# Patient Record
Sex: Female | Born: 1935 | Race: White | Hispanic: No | Smoking: Former smoker
Health system: Southern US, Community
[De-identification: ages and names within clinical notes are randomized; demographics above are authoritative.]

## PROBLEM LIST (undated history)

## (undated) DIAGNOSIS — I10 Essential (primary) hypertension: Secondary | ICD-10-CM

## (undated) DIAGNOSIS — C3490 Malignant neoplasm of unspecified part of unspecified bronchus or lung: Secondary | ICD-10-CM

## (undated) DIAGNOSIS — C50919 Malignant neoplasm of unspecified site of unspecified female breast: Secondary | ICD-10-CM

## (undated) DIAGNOSIS — J449 Chronic obstructive pulmonary disease, unspecified: Secondary | ICD-10-CM

## (undated) DIAGNOSIS — E119 Type 2 diabetes mellitus without complications: Secondary | ICD-10-CM

## (undated) DIAGNOSIS — IMO0002 Reserved for concepts with insufficient information to code with codable children: Secondary | ICD-10-CM

## (undated) HISTORY — PX: BREAST LUMPECTOMY: SHX2

## (undated) HISTORY — DX: Type 2 diabetes mellitus without complications: E11.9

## (undated) HISTORY — DX: Malignant neoplasm of unspecified part of unspecified bronchus or lung: C34.90

## (undated) HISTORY — DX: Malignant neoplasm of unspecified site of unspecified female breast: C50.919

## (undated) HISTORY — PX: LUNG REMOVAL, PARTIAL: SHX233

## (undated) HISTORY — DX: Chronic obstructive pulmonary disease, unspecified: J44.9

## (undated) HISTORY — PX: ABDOMINAL HYSTERECTOMY: SHX81

## (undated) HISTORY — DX: Essential (primary) hypertension: I10

## (undated) HISTORY — DX: Reserved for concepts with insufficient information to code with codable children: IMO0002

## (undated) HISTORY — PX: BACK SURGERY: SHX140

---

## 2000-06-29 ENCOUNTER — Inpatient Hospital Stay (HOSPITAL_COMMUNITY): Admission: RE | Admit: 2000-06-29 | Discharge: 2000-06-29 | Payer: Self-pay | Admitting: Neurosurgery

## 2000-06-29 ENCOUNTER — Encounter: Payer: Self-pay | Admitting: Neurosurgery

## 2000-09-11 ENCOUNTER — Encounter: Payer: Self-pay | Admitting: Neurosurgery

## 2000-09-11 ENCOUNTER — Encounter: Admission: RE | Admit: 2000-09-11 | Discharge: 2000-09-11 | Payer: Self-pay | Admitting: Neurosurgery

## 2005-12-19 ENCOUNTER — Ambulatory Visit: Payer: Self-pay | Admitting: Internal Medicine

## 2006-11-20 ENCOUNTER — Ambulatory Visit: Payer: Self-pay | Admitting: Internal Medicine

## 2006-12-06 ENCOUNTER — Ambulatory Visit: Payer: Self-pay | Admitting: Internal Medicine

## 2010-01-26 ENCOUNTER — Ambulatory Visit: Payer: Self-pay | Admitting: Internal Medicine

## 2010-02-05 ENCOUNTER — Ambulatory Visit: Payer: Self-pay | Admitting: Internal Medicine

## 2010-02-16 ENCOUNTER — Ambulatory Visit: Payer: Self-pay | Admitting: Internal Medicine

## 2010-02-23 ENCOUNTER — Ambulatory Visit: Payer: Self-pay | Admitting: Internal Medicine

## 2010-02-24 LAB — PROT IMMUNOELECTROPHORES(ARMC)

## 2010-02-24 LAB — CANCER ANTIGEN 27.29: CA 27.29: 29.7 U/mL (ref 0.0–38.6)

## 2010-02-25 ENCOUNTER — Ambulatory Visit: Payer: Self-pay | Admitting: Internal Medicine

## 2010-03-28 ENCOUNTER — Ambulatory Visit: Payer: Self-pay | Admitting: Internal Medicine

## 2010-04-06 ENCOUNTER — Ambulatory Visit: Payer: Self-pay | Admitting: Surgery

## 2011-03-07 ENCOUNTER — Ambulatory Visit: Payer: Self-pay | Admitting: Internal Medicine

## 2012-03-29 ENCOUNTER — Ambulatory Visit: Payer: Self-pay | Admitting: Internal Medicine

## 2013-06-11 ENCOUNTER — Inpatient Hospital Stay: Payer: Self-pay | Admitting: Internal Medicine

## 2013-06-11 LAB — URINALYSIS, COMPLETE
BLOOD: NEGATIVE
Bilirubin,UR: NEGATIVE
GLUCOSE, UR: NEGATIVE mg/dL (ref 0–75)
Ketone: NEGATIVE
Nitrite: NEGATIVE
PH: 5 (ref 4.5–8.0)
Protein: NEGATIVE
RBC,UR: 1 /HPF (ref 0–5)
SPECIFIC GRAVITY: 1.012 (ref 1.003–1.030)
Squamous Epithelial: <1
WBC UR: 7 /HPF (ref 0–5)

## 2013-06-11 LAB — CBC
HCT: 35.3 % (ref 35.0–47.0)
HGB: 10.8 g/dL — ABNORMAL LOW (ref 12.0–16.0)
MCH: 27.4 pg (ref 26.0–34.0)
MCHC: 30.7 g/dL — ABNORMAL LOW (ref 32.0–36.0)
MCV: 89 fL (ref 80–100)
Platelet: 278 10*3/uL (ref 150–440)
RBC: 3.95 10*6/uL (ref 3.80–5.20)
RDW: 15.1 % — ABNORMAL HIGH (ref 11.5–14.5)
WBC: 11.3 10*3/uL — ABNORMAL HIGH (ref 3.6–11.0)

## 2013-06-11 LAB — COMPREHENSIVE METABOLIC PANEL
ALBUMIN: 3 g/dL — AB (ref 3.4–5.0)
Alkaline Phosphatase: 68 U/L
Anion Gap: 2 — ABNORMAL LOW (ref 7–16)
BUN: 26 mg/dL — AB (ref 7–18)
Bilirubin,Total: 0.2 mg/dL (ref 0.2–1.0)
CALCIUM: 8.7 mg/dL (ref 8.5–10.1)
CHLORIDE: 110 mmol/L — AB (ref 98–107)
CO2: 27 mmol/L (ref 21–32)
CREATININE: 1.08 mg/dL (ref 0.60–1.30)
EGFR (African American): 53 — ABNORMAL LOW
GFR CALC NON AF AMER: 46 — AB
Glucose: 133 mg/dL — ABNORMAL HIGH (ref 65–99)
OSMOLALITY: 284 (ref 275–301)
Potassium: 4.3 mmol/L (ref 3.5–5.1)
SGOT(AST): 18 U/L (ref 15–37)
SGPT (ALT): 15 U/L (ref 12–78)
SODIUM: 139 mmol/L (ref 136–145)
Total Protein: 6.3 g/dL — ABNORMAL LOW (ref 6.4–8.2)

## 2013-06-11 LAB — TROPONIN I: Troponin-I: <0.02 ng/mL

## 2013-06-13 LAB — BASIC METABOLIC PANEL
Anion Gap: 5 — ABNORMAL LOW (ref 7–16)
BUN: 19 mg/dL — AB (ref 7–18)
CHLORIDE: 105 mmol/L (ref 98–107)
CO2: 27 mmol/L (ref 21–32)
Calcium, Total: 8.9 mg/dL (ref 8.5–10.1)
Creatinine: 1.1 mg/dL (ref 0.60–1.30)
EGFR (Non-African Amer.): 45 — ABNORMAL LOW
GFR CALC AF AMER: 52 — AB
Glucose: 114 mg/dL — ABNORMAL HIGH (ref 65–99)
OSMOLALITY: 277 (ref 275–301)
Potassium: 4.5 mmol/L (ref 3.5–5.1)
SODIUM: 137 mmol/L (ref 136–145)

## 2013-06-13 LAB — CBC WITH DIFFERENTIAL/PLATELET
BASOS ABS: 0 10*3/uL (ref 0.0–0.1)
BASOS PCT: 0.5 %
Eosinophil #: 0.2 10*3/uL (ref 0.0–0.7)
Eosinophil %: 2.5 %
HCT: 33.6 % — AB (ref 35.0–47.0)
HGB: 11 g/dL — AB (ref 12.0–16.0)
Lymphocyte #: 1.1 10*3/uL (ref 1.0–3.6)
Lymphocyte %: 14.1 %
MCH: 28.8 pg (ref 26.0–34.0)
MCHC: 32.6 g/dL (ref 32.0–36.0)
MCV: 88 fL (ref 80–100)
Monocyte #: 1 x10 3/mm — ABNORMAL HIGH (ref 0.2–0.9)
Monocyte %: 12.3 %
Neutrophil #: 5.5 10*3/uL (ref 1.4–6.5)
Neutrophil %: 70.6 %
Platelet: 219 10*3/uL (ref 150–440)
RBC: 3.81 10*6/uL (ref 3.80–5.20)
RDW: 15.1 % — AB (ref 11.5–14.5)
WBC: 7.7 10*3/uL (ref 3.6–11.0)

## 2013-06-14 LAB — HEMATOCRIT: HCT: 34.5 % — AB (ref 35.0–47.0)

## 2013-06-15 LAB — BASIC METABOLIC PANEL
ANION GAP: 3 — AB (ref 7–16)
BUN: 34 mg/dL — ABNORMAL HIGH (ref 7–18)
CO2: 28 mmol/L (ref 21–32)
Calcium, Total: 8.3 mg/dL — ABNORMAL LOW (ref 8.5–10.1)
Chloride: 106 mmol/L (ref 98–107)
Creatinine: 1.13 mg/dL (ref 0.60–1.30)
EGFR (African American): 51 — ABNORMAL LOW
GFR CALC NON AF AMER: 44 — AB
Glucose: 117 mg/dL — ABNORMAL HIGH (ref 65–99)
Osmolality: 282 (ref 275–301)
POTASSIUM: 4 mmol/L (ref 3.5–5.1)
SODIUM: 137 mmol/L (ref 136–145)

## 2013-06-15 LAB — CBC WITH DIFFERENTIAL/PLATELET
BASOS ABS: 0.1 10*3/uL (ref 0.0–0.1)
BASOS PCT: 0.6 %
EOS ABS: 0.4 10*3/uL (ref 0.0–0.7)
EOS PCT: 4.2 %
HCT: 32.8 % — AB (ref 35.0–47.0)
HGB: 10.7 g/dL — AB (ref 12.0–16.0)
LYMPHS PCT: 19.8 %
Lymphocyte #: 1.7 10*3/uL (ref 1.0–3.6)
MCH: 28.8 pg (ref 26.0–34.0)
MCHC: 32.5 g/dL (ref 32.0–36.0)
MCV: 89 fL (ref 80–100)
MONO ABS: 1.3 x10 3/mm — AB (ref 0.2–0.9)
MONOS PCT: 14.9 %
NEUTROS ABS: 5.1 10*3/uL (ref 1.4–6.5)
NEUTROS PCT: 60.5 %
Platelet: 254 10*3/uL (ref 150–440)
RBC: 3.7 10*6/uL — AB (ref 3.80–5.20)
RDW: 15 % — ABNORMAL HIGH (ref 11.5–14.5)
WBC: 8.5 10*3/uL (ref 3.6–11.0)

## 2013-06-16 LAB — CBC WITH DIFFERENTIAL/PLATELET
BASOS ABS: 0.1 10*3/uL (ref 0.0–0.1)
Basophil %: 0.7 %
EOS PCT: 6.9 %
Eosinophil #: 0.5 10*3/uL (ref 0.0–0.7)
HCT: 31.3 % — ABNORMAL LOW (ref 35.0–47.0)
HGB: 10.6 g/dL — ABNORMAL LOW (ref 12.0–16.0)
LYMPHS PCT: 23.1 %
Lymphocyte #: 1.8 10*3/uL (ref 1.0–3.6)
MCH: 30.2 pg (ref 26.0–34.0)
MCHC: 33.9 g/dL (ref 32.0–36.0)
MCV: 89 fL (ref 80–100)
MONO ABS: 1 x10 3/mm — AB (ref 0.2–0.9)
MONOS PCT: 12.8 %
NEUTROS PCT: 56.5 %
Neutrophil #: 4.4 10*3/uL (ref 1.4–6.5)
PLATELETS: 264 10*3/uL (ref 150–440)
RBC: 3.51 10*6/uL — ABNORMAL LOW (ref 3.80–5.20)
RDW: 14.9 % — ABNORMAL HIGH (ref 11.5–14.5)
WBC: 7.8 10*3/uL (ref 3.6–11.0)

## 2013-06-16 LAB — BASIC METABOLIC PANEL
ANION GAP: 5 — AB (ref 7–16)
BUN: 37 mg/dL — ABNORMAL HIGH (ref 7–18)
CALCIUM: 8.4 mg/dL — AB (ref 8.5–10.1)
Chloride: 106 mmol/L (ref 98–107)
Co2: 28 mmol/L (ref 21–32)
Creatinine: 1.14 mg/dL (ref 0.60–1.30)
EGFR (African American): 50 — ABNORMAL LOW
EGFR (Non-African Amer.): 43 — ABNORMAL LOW
GLUCOSE: 125 mg/dL — AB (ref 65–99)
Osmolality: 288 (ref 275–301)
Potassium: 4.6 mmol/L (ref 3.5–5.1)
Sodium: 139 mmol/L (ref 136–145)

## 2013-06-17 ENCOUNTER — Encounter: Payer: Self-pay | Admitting: Internal Medicine

## 2013-06-17 LAB — BASIC METABOLIC PANEL
ANION GAP: 3 — AB (ref 7–16)
BUN: 35 mg/dL — ABNORMAL HIGH (ref 7–18)
CALCIUM: 8.7 mg/dL (ref 8.5–10.1)
Chloride: 108 mmol/L — ABNORMAL HIGH (ref 98–107)
Co2: 27 mmol/L (ref 21–32)
Creatinine: 1.02 mg/dL (ref 0.60–1.30)
EGFR (Non-African Amer.): 49 — ABNORMAL LOW
GFR CALC AF AMER: 57 — AB
Glucose: 110 mg/dL — ABNORMAL HIGH (ref 65–99)
Osmolality: 284 (ref 275–301)
POTASSIUM: 4.7 mmol/L (ref 3.5–5.1)
Sodium: 138 mmol/L (ref 136–145)

## 2013-06-17 LAB — CBC WITH DIFFERENTIAL/PLATELET
Basophil #: 0 10*3/uL (ref 0.0–0.1)
Basophil %: 0.5 %
Eosinophil #: 0.5 10*3/uL (ref 0.0–0.7)
Eosinophil %: 6.5 %
HCT: 32.5 % — AB (ref 35.0–47.0)
HGB: 10.4 g/dL — ABNORMAL LOW (ref 12.0–16.0)
LYMPHS PCT: 18.1 %
Lymphocyte #: 1.4 10*3/uL (ref 1.0–3.6)
MCH: 28.6 pg (ref 26.0–34.0)
MCHC: 32.1 g/dL (ref 32.0–36.0)
MCV: 89 fL (ref 80–100)
MONOS PCT: 12.3 %
Monocyte #: 0.9 x10 3/mm (ref 0.2–0.9)
NEUTROS ABS: 4.8 10*3/uL (ref 1.4–6.5)
Neutrophil %: 62.6 %
PLATELETS: 309 10*3/uL (ref 150–440)
RBC: 3.64 10*6/uL — ABNORMAL LOW (ref 3.80–5.20)
RDW: 15.2 % — ABNORMAL HIGH (ref 11.5–14.5)
WBC: 7.7 10*3/uL (ref 3.6–11.0)

## 2013-06-26 ENCOUNTER — Encounter: Payer: Self-pay | Admitting: Internal Medicine

## 2013-11-06 ENCOUNTER — Other Ambulatory Visit: Payer: Self-pay | Admitting: Internal Medicine

## 2013-11-06 DIAGNOSIS — R05 Cough: Secondary | ICD-10-CM | POA: Insufficient documentation

## 2013-11-06 DIAGNOSIS — R059 Cough, unspecified: Secondary | ICD-10-CM | POA: Insufficient documentation

## 2013-11-06 DIAGNOSIS — R600 Localized edema: Secondary | ICD-10-CM | POA: Insufficient documentation

## 2013-11-06 LAB — D-DIMER(ARMC): D-Dimer: 1170 ng/ml

## 2013-11-08 ENCOUNTER — Ambulatory Visit: Payer: Self-pay | Admitting: Internal Medicine

## 2013-11-11 ENCOUNTER — Ambulatory Visit: Payer: Self-pay | Admitting: Internal Medicine

## 2013-11-21 ENCOUNTER — Ambulatory Visit: Payer: Self-pay | Admitting: Internal Medicine

## 2013-11-28 ENCOUNTER — Ambulatory Visit: Payer: Self-pay | Admitting: Internal Medicine

## 2013-12-04 ENCOUNTER — Ambulatory Visit: Payer: Self-pay | Admitting: Internal Medicine

## 2013-12-04 LAB — CBC CANCER CENTER
Basophil #: 0.1 x10 3/mm (ref 0.0–0.1)
Basophil %: 1.1 %
Eosinophil #: 0.3 x10 3/mm (ref 0.0–0.7)
Eosinophil %: 3.6 %
HCT: 34.6 % — ABNORMAL LOW (ref 35.0–47.0)
HGB: 10.8 g/dL — ABNORMAL LOW (ref 12.0–16.0)
Lymphocyte #: 1.5 x10 3/mm (ref 1.0–3.6)
Lymphocyte %: 18.7 %
MCH: 26.1 pg (ref 26.0–34.0)
MCHC: 31.3 g/dL — ABNORMAL LOW (ref 32.0–36.0)
MCV: 84 fL (ref 80–100)
Monocyte #: 0.6 x10 3/mm (ref 0.2–0.9)
Monocyte %: 7.8 %
Neutrophil #: 5.7 x10 3/mm (ref 1.4–6.5)
Neutrophil %: 68.8 %
Platelet: 313 x10 3/mm (ref 150–440)
RBC: 4.14 10*6/uL (ref 3.80–5.20)
RDW: 17.6 % — ABNORMAL HIGH (ref 11.5–14.5)
WBC: 8.3 x10 3/mm (ref 3.6–11.0)

## 2013-12-04 LAB — HEPATIC FUNCTION PANEL A (ARMC)
Albumin: 3.1 g/dL — ABNORMAL LOW (ref 3.4–5.0)
Alkaline Phosphatase: 66 U/L
Bilirubin, Direct: <0.05 mg/dL (ref 0.00–0.20)
Bilirubin,Total: 0.2 mg/dL (ref 0.2–1.0)
SGOT(AST): 17 U/L (ref 15–37)
SGPT (ALT): 16 U/L
Total Protein: 6.6 g/dL (ref 6.4–8.2)

## 2013-12-04 LAB — BASIC METABOLIC PANEL
Anion Gap: 7 (ref 7–16)
BUN: 23 mg/dL — ABNORMAL HIGH (ref 7–18)
Calcium, Total: 9 mg/dL (ref 8.5–10.1)
Chloride: 106 mmol/L (ref 98–107)
Co2: 28 mmol/L (ref 21–32)
Creatinine: 1.18 mg/dL (ref 0.60–1.30)
EGFR (African American): 48 — ABNORMAL LOW
EGFR (Non-African Amer.): 41 — ABNORMAL LOW
Glucose: 74 mg/dL (ref 65–99)
Osmolality: 284 (ref 275–301)
Potassium: 4.4 mmol/L (ref 3.5–5.1)
Sodium: 141 mmol/L (ref 136–145)

## 2013-12-04 LAB — APTT: ACTIVATED PTT: 28.3 s (ref 23.6–35.9)

## 2013-12-04 LAB — PROTIME-INR
INR: 0.9
PROTHROMBIN TIME: 12.3 s (ref 11.5–14.7)

## 2013-12-13 ENCOUNTER — Ambulatory Visit: Payer: Self-pay | Admitting: Internal Medicine

## 2013-12-26 ENCOUNTER — Ambulatory Visit: Payer: Self-pay | Admitting: Internal Medicine

## 2014-01-14 LAB — CBC CANCER CENTER
BASOS PCT: 0.6 %
Basophil #: 0 x10 3/mm (ref 0.0–0.1)
EOS ABS: 0.3 x10 3/mm (ref 0.0–0.7)
EOS PCT: 4.1 %
HCT: 34.7 % — AB (ref 35.0–47.0)
HGB: 10.6 g/dL — ABNORMAL LOW (ref 12.0–16.0)
LYMPHS PCT: 14.9 %
Lymphocyte #: 1.1 x10 3/mm (ref 1.0–3.6)
MCH: 26.2 pg (ref 26.0–34.0)
MCHC: 30.6 g/dL — ABNORMAL LOW (ref 32.0–36.0)
MCV: 85 fL (ref 80–100)
Monocyte #: 0.6 x10 3/mm (ref 0.2–0.9)
Monocyte %: 8 %
NEUTROS PCT: 72.4 %
Neutrophil #: 5.6 x10 3/mm (ref 1.4–6.5)
Platelet: 318 x10 3/mm (ref 150–440)
RBC: 4.06 10*6/uL (ref 3.80–5.20)
RDW: 16.7 % — ABNORMAL HIGH (ref 11.5–14.5)
WBC: 7.7 x10 3/mm (ref 3.6–11.0)

## 2014-01-21 LAB — CBC CANCER CENTER
BASOS ABS: 0 x10 3/mm (ref 0.0–0.1)
Basophil %: 0.4 %
Eosinophil #: 0.2 x10 3/mm (ref 0.0–0.7)
Eosinophil %: 3 %
HCT: 34 % — ABNORMAL LOW (ref 35.0–47.0)
HGB: 10.6 g/dL — AB (ref 12.0–16.0)
Lymphocyte #: 0.8 x10 3/mm — ABNORMAL LOW (ref 1.0–3.6)
Lymphocyte %: 10.5 %
MCH: 26.8 pg (ref 26.0–34.0)
MCHC: 31.3 g/dL — AB (ref 32.0–36.0)
MCV: 86 fL (ref 80–100)
MONO ABS: 0.9 x10 3/mm (ref 0.2–0.9)
Monocyte %: 12.1 %
NEUTROS PCT: 74 %
Neutrophil #: 5.5 x10 3/mm (ref 1.4–6.5)
Platelet: 335 x10 3/mm (ref 150–440)
RBC: 3.97 10*6/uL (ref 3.80–5.20)
RDW: 16.7 % — ABNORMAL HIGH (ref 11.5–14.5)
WBC: 7.4 x10 3/mm (ref 3.6–11.0)

## 2014-01-26 ENCOUNTER — Ambulatory Visit: Payer: Self-pay | Admitting: Internal Medicine

## 2014-01-28 LAB — CBC CANCER CENTER
BASOS PCT: 0.6 %
Basophil #: 0.1 x10 3/mm (ref 0.0–0.1)
EOS ABS: 0.2 x10 3/mm (ref 0.0–0.7)
Eosinophil %: 2.6 %
HCT: 34.5 % — AB (ref 35.0–47.0)
HGB: 10.8 g/dL — ABNORMAL LOW (ref 12.0–16.0)
Lymphocyte #: 0.6 x10 3/mm — ABNORMAL LOW (ref 1.0–3.6)
Lymphocyte %: 7.9 %
MCH: 26.7 pg (ref 26.0–34.0)
MCHC: 31.2 g/dL — ABNORMAL LOW (ref 32.0–36.0)
MCV: 86 fL (ref 80–100)
Monocyte #: 0.7 x10 3/mm (ref 0.2–0.9)
Monocyte %: 9.2 %
Neutrophil #: 6.5 x10 3/mm (ref 1.4–6.5)
Neutrophil %: 79.7 %
Platelet: 373 x10 3/mm (ref 150–440)
RBC: 4.03 10*6/uL (ref 3.80–5.20)
RDW: 16.6 % — ABNORMAL HIGH (ref 11.5–14.5)
WBC: 8.1 x10 3/mm (ref 3.6–11.0)

## 2014-02-04 LAB — CBC CANCER CENTER
BASOS ABS: 0.1 x10 3/mm (ref 0.0–0.1)
Basophil %: 0.9 %
EOS ABS: 0.3 x10 3/mm (ref 0.0–0.7)
Eosinophil %: 3.6 %
HCT: 34.7 % — ABNORMAL LOW (ref 35.0–47.0)
HGB: 10.8 g/dL — ABNORMAL LOW (ref 12.0–16.0)
LYMPHS ABS: 0.6 x10 3/mm — AB (ref 1.0–3.6)
Lymphocyte %: 8 %
MCH: 26.8 pg (ref 26.0–34.0)
MCHC: 31.3 g/dL — ABNORMAL LOW (ref 32.0–36.0)
MCV: 86 fL (ref 80–100)
MONO ABS: 0.8 x10 3/mm (ref 0.2–0.9)
MONOS PCT: 10 %
NEUTROS PCT: 77.5 %
Neutrophil #: 5.9 x10 3/mm (ref 1.4–6.5)
Platelet: 380 x10 3/mm (ref 150–440)
RBC: 4.04 10*6/uL (ref 3.80–5.20)
RDW: 16.3 % — AB (ref 11.5–14.5)
WBC: 7.6 x10 3/mm (ref 3.6–11.0)

## 2014-02-11 LAB — CBC CANCER CENTER
BASOS ABS: 0 x10 3/mm (ref 0.0–0.1)
BASOS PCT: 0.5 %
EOS ABS: 0.3 x10 3/mm (ref 0.0–0.7)
Eosinophil %: 4 %
HCT: 36.4 % (ref 35.0–47.0)
HGB: 11.3 g/dL — ABNORMAL LOW (ref 12.0–16.0)
Lymphocyte #: 0.5 x10 3/mm — ABNORMAL LOW (ref 1.0–3.6)
Lymphocyte %: 6.2 %
MCH: 26.7 pg (ref 26.0–34.0)
MCHC: 31.1 g/dL — ABNORMAL LOW (ref 32.0–36.0)
MCV: 86 fL (ref 80–100)
Monocyte #: 0.8 x10 3/mm (ref 0.2–0.9)
Monocyte %: 9.5 %
NEUTROS PCT: 79.8 %
Neutrophil #: 6.6 x10 3/mm — ABNORMAL HIGH (ref 1.4–6.5)
Platelet: 338 x10 3/mm (ref 150–440)
RBC: 4.24 10*6/uL (ref 3.80–5.20)
RDW: 16.5 % — ABNORMAL HIGH (ref 11.5–14.5)
WBC: 8.2 x10 3/mm (ref 3.6–11.0)

## 2014-02-18 LAB — CBC CANCER CENTER
BASOS ABS: 0 x10 3/mm (ref 0.0–0.1)
BASOS PCT: 0.6 %
EOS PCT: 3.9 %
Eosinophil #: 0.3 x10 3/mm (ref 0.0–0.7)
HCT: 35.6 % (ref 35.0–47.0)
HGB: 11.1 g/dL — AB (ref 12.0–16.0)
Lymphocyte #: 0.5 x10 3/mm — ABNORMAL LOW (ref 1.0–3.6)
Lymphocyte %: 6.3 %
MCH: 26.5 pg (ref 26.0–34.0)
MCHC: 31 g/dL — ABNORMAL LOW (ref 32.0–36.0)
MCV: 85 fL (ref 80–100)
Monocyte #: 0.7 x10 3/mm (ref 0.2–0.9)
Monocyte %: 10 %
NEUTROS PCT: 79.2 %
Neutrophil #: 5.9 x10 3/mm (ref 1.4–6.5)
Platelet: 304 x10 3/mm (ref 150–440)
RBC: 4.17 10*6/uL (ref 3.80–5.20)
RDW: 16.5 % — ABNORMAL HIGH (ref 11.5–14.5)
WBC: 7.4 x10 3/mm (ref 3.6–11.0)

## 2014-02-25 ENCOUNTER — Ambulatory Visit: Payer: Self-pay | Admitting: Internal Medicine

## 2014-03-28 ENCOUNTER — Ambulatory Visit: Payer: Self-pay | Admitting: Internal Medicine

## 2014-04-28 ENCOUNTER — Ambulatory Visit: Payer: Self-pay | Admitting: Internal Medicine

## 2014-05-07 ENCOUNTER — Ambulatory Visit: Payer: Self-pay | Admitting: Internal Medicine

## 2014-05-13 ENCOUNTER — Ambulatory Visit: Payer: Self-pay | Admitting: Surgery

## 2014-05-27 ENCOUNTER — Ambulatory Visit
Admit: 2014-05-27 | Disposition: A | Discharge status: Home / Self Care | Payer: Self-pay | Attending: Internal Medicine | Admitting: Internal Medicine

## 2014-07-17 ENCOUNTER — Ambulatory Visit
Admit: 2014-07-17 | Disposition: A | Discharge status: Home / Self Care | Payer: Self-pay | Attending: Internal Medicine | Admitting: Internal Medicine

## 2014-07-19 NOTE — H&P (Signed)
PATIENT NAME:  Tammy Molina, Tammy Molina MR#:  [id number] DATE OF BIRTH:  12/26/1923  DATE OF ADMISSION:  06/11/2013  PRIMARY CARE PHYSICIAN:  Dr. Ramonita Lab.  CHIEF COMPLAINT: Right hip pain after fall.   Tammy Molina is a very pleasant 81 year old Caucasian female with past medical history of hypertension, diabetes, comes to the Emergency Room after she had sustained a fall at home. The patient was apparently trying to search for her cat and bent down to look under her bed. She got up quickly, felt dizzy and lost balance and fell. She denies any head injury; however, she landed on her right hip and sustained a right greater trochanter fracture. The patient in currently in the Emergency Room complaining of right hip pain. She denies any other trauma. She is being admitted for further evaluation and management.   PAST MEDICAL HISTORY: 1.  Type 2 diabetes.  2.  Hypertension.  3.  Compression fracture.  4.  COPD.  5.  History of breast cancer.  6.  History of lung cancer.  7.  Back surgery x 2.  8.  History of lung resection.  9.  Cholecystectomy.  10.  Hysterectomy.  11.  Partial right mastectomy.   HOME MEDICATION LIST:  According to EMS is: 1.  Calcium with vitamin D p.o. daily.  2.  Vitamin B12 p.o. daily.  3.  Glipizide 5 mg daily.  4.  Cyanocobalamin 1000 mcg p.o. daily.  5.  Enalapril 10 mg daily.  6.  Ranitidine 150 mg daily.  7.  Hydrochlorothiazide 25 mg daily.   Please note, her medications still has to be reconciled by pharmacy tech.  ALLERGIES: ASPIRIN, PENICILLIN, MORPHINE AND TAPE.   FAMILY HISTORY: Hypertension.   SOCIAL HISTORY: The patient lives at home by herself. She uses a walker when ambulating. Nonsmoker, nonalcoholic.   REVIEW OF SYSTEMS:    CONSTITUTIONAL: No fever. Positive for right hip pain. No fatigue, weakness.  EYES: No blurred, double vision, glaucoma, cataract.  ENT: No tinnitus, ear pain, hearing loss.  RESPIRATORY: No cough, wheeze, hemoptysis.   CARDIOVASCULAR: No chest pain, orthopnea, edema.  GASTROINTESTINAL: No nausea, vomiting, diarrhea, abdominal pain.  GENITOURINARY: No dysuria or hematuria.  ENDOCRINE: No polyuria or nocturia or thyroid problems.  HEMATOLOGY: No anemia or easy bruising.  SKIN: No acne or rash.  MUSCULOSKELETAL: Positive for right hip pain status post fall. Positive for arthritis.  NEUROLOGIC: No CVA, TIA. Positive for generalized weakness. No tremors or vertigo.   PSYCHIATRIC: No anxiety or depression. All other systems reviewed are negative.   PHYSICAL EXAMINATION: GENERAL: The patient is awake, alert, oriented x 3, not in acute distress.  VITAL SIGNS: She is afebrile. Pulse is 87. Blood pressure is 177/77.  HEENT: Atraumatic, normocephalic. Pupils: PERRLA. EOM intact. Oral mucosa is moist.  NECK: Supple. No JVD. No carotid bruit.  LUNGS: Clear to auscultation bilaterally. No rales, rhonchi, respiratory distress or labored breathing.  HEART: Both the heart sounds are normal. Rate, rhythm regular. PMI not lateralized. Chest is nontender.  EXTREMITIES: Good pedal pulses, good femoral pulses. No lower extremity edema.  NEUROLOGIC:  Grossly intact cranial nerves II through XII. No motor or sensory deficit.  PSYCHIATRIC: The patient is awake, alert, oriented x 3.   EKG shows normal sinus rhythm.   IMAGING AND LABORATORY DATA: X-ray right hip shows greater trochanteric fracture.   UA negative for UTI. Comprehensive metabolic panel within normal limits, except albumin of 3.0. White count is 11.3. H and H are 10.8  and 35.3. Platelet count is 278. Troponin is less than 0.02. Right hip shows minimally displaced greater trochanteric right hip fracture.   CHEST X-RAY: No acute cardiopulmonary abnormality. There is mild diffuse interstitial prominence.   ASSESSMENT AND PLAN: An 81 year old Tammy Molina with history of hypertension and type 2 diabetes, comes into the Emergency Room after she had a fall at home. She is  being admitted with:  1.  Acute right hip pain secondary to minimally displaced right greater trochanteric hip fracture. The patient was evaluated by Dr. Rudene Christians, who recommends conservative management. We will initiate physical therapy, give IV pain control with ketorolac p.r.n. THE PATIENT HAS ALLERGIES TO MORPHINE. We will also give p.o. Tylenol as well. We will have Dr. Rudene Christians see the patient and physical therapy will be consulted. Care management/social worker for discharge planning.  2. Hypertension. We will resume her enalapril and hydrochlorothiazide after her dosage is confirmed tomorrow.  3.  Type 2 diabetes. I will continue her glipizide; however, we will have to confirm her dose. In the meantime, we will put her on sliding scale insulin.  4.  Gastroesophageal reflux disease, continue ranitidine.  5.  Deep vein thrombosis prophylaxis, subQ heparin.   We did call and talk with the patient's friend/caregiver, Eusebio Me, whom I spoke with on the phone. Further workup per the patient's clinical course. Hospital admission plan was discussed with the patient.   TIME SPENT: 50 minutes.     ____________________________ Hart Rochester Posey Pronto, MD sap:dmm D: 06/11/2013 21:21:01 ET T: 06/11/2013 22:08:36 ET JOB#: [id number]  cc: Benjamin Casanas A. Posey Pronto, MD, <Dictator> Tama High III, MD Ilda Basset MD ELECTRONICALLY SIGNED 06/14/2013 10:48

## 2014-07-19 NOTE — Consult Note (Signed)
PATIENT NAME:  Tammy Molina, Tammy Molina MR#:  [id number] DATE OF BIRTH:  1924-03-06  DATE OF CONSULTATION:  06/12/2013  REFERRING PHYSICIAN:   CONSULTING PHYSICIAN:  Timoteo Gaul, MD  REASON FOR CONSULTATION:  Right greater trochanter fracture.   HISTORY OF PRESENT ILLNESS:  Ms. Lansdale is an 81 year old female who experienced dizziness at home after she had bent down to look for her cat underneath the bed.  When she got up she lost her balance and fell, landing on her right hip.  At the [facility] Emergency Department she was diagnosed with a nondisplaced greater trochanter fracture.   PAST MEDICAL HISTORY:  Type 2 diabetes, hypertension, history of compression fracture, COPD, history of breast cancer and lung cancer, back surgeries x 2, history of lung resection, cholecystectomy, hysterectomy and a partial right mastectomy.   MEDICATIONS:  Include calcium with vitamin D, vitamin B12, glipizide, cyanocobalamin, enalapril, ranitidine, hydrochlorothiazide.   ALLERGIES:  ASPIRIN, PENICILLIN, MORPHINE.   SOCIAL HISTORY:  The patient lives at home by herself.  She uses a walker when ambulating.  She is currently a nonsmoker and does not use alcohol.   PHYSICAL EXAMINATION:   The patient was alert and conversant today at the bedside.  She follows commands.  She was having mild discomfort over the lateral aspect of the right leg and was complaining of lower extremity muscle spasms.   The patient's skin overlying the right hip was intact.  There is no erythema, ecchymosis or swelling.  Thigh compartments are soft and compressible.  She has intact sensation to light touch throughout the right lower extremity.  She had palpable pedal pulses.  She could flex and extend all five toes as well as dorsiflex and plantar flex her ankle without weakness.  She had minimal discomfort with logrolling.  She could actively flex her knee and hip to approximately 60 degrees, but had pain over the lateral hip with  flexion and felt her right lower extremity was weak when attempting this activity.   RADIOLOGIC STUDIES:  X-ray films of the right hip were reviewed by me today.  This shows a nondisplaced fracture of the tip of the greater trochanter.   ASSESSMENT:  Nondisplaced fracture of the tip of the greater trochanter.   PLAN:  Ms. Frost fortunately will not require any surgery for her above-noted injury.  I recommend physical therapy evaluation.  She may weight-bear as tolerated on the right lower extremity, but should avoid active abduction of the right hip.  She may follow-up in my office in 4 to 6 weeks for follow-up.  She will continue using her walker for ambulation.     ____________________________ Timoteo Gaul, MD klk:ea D: 06/12/2013 22:27:34 ET T: 06/12/2013 23:23:03 ET JOB#: [id number]  cc: Timoteo Gaul, MD, <Dictator> Timoteo Gaul MD ELECTRONICALLY SIGNED 06/18/2013 18:21

## 2014-07-19 NOTE — Discharge Summary (Signed)
PATIENT NAME:  Tammy Molina, Tammy Molina MR#:  [id number] DATE OF BIRTH:  Nov 15, 1923  DATE OF ADMISSION:  06/11/2013 DATE OF DISCHARGE: 06/17/2013   FINAL DIAGNOSES:  1.  Nondisplaced right hip fracture.  2.  Hypertension.  3.  Anxiety with depression.  4.  Vertigo.  5.  Adult onset diabetes mellitus.  6.  History of compression fracture.  7.  Chronic obstructive pulmonary disease.  8.  History of breast cancer.  9.  History of lung cancer.  10. Prior back surgery x 2.  11. History of partial lung resection.  12. Cholecystectomy.  13. Hysterectomy.  14. History of mastectomy.   HISTORY AND PHYSICAL: Please see dictated admission history and physical.   HOSPITAL COURSE: The patient was admitted after a fall due to vertigo. She suffered a right hip fracture. She was evaluated by surgery, who recommended conservative therapy, no surgery needed. Pain control was really not a major issue; Dilaudid was use initially, however following that she was able to be controlled with Tylenol only and this was given just prior to getting up as she did not have pain at rest.   Blood pressure initially low, blood pressure medications were initially held, and these were reintroduced with reasonable blood pressure control. Her sugars remained stable and glipizide was restarted.   The patient with history of anxiety, previously had been on lorazepam 1 mg at bedtime, this was reduced to 0.5 mg and she tolerated this without significant sedation the next morning. She had complained of poor sleep, poor appetite and mirtazapine was added at a low dose and she tolerated this well, and it appeared to improve her sleep and her appetite. The patient was ambulated in physical therapy and was shown to have very poor distance secondary to discomfort; she recognized that she would need additional help to regain her independence and so a nursing home placement was recommended. A bed search began and a bed became available with the  patient being discharged in stable condition with physical activity up with a walker with assistance as tolerated. She will follow a carbohydrate controlled diet. Her blood sugar should be checked daily, with a physician called for blood sugar less than 70 or greater than 250. She will follow up with the nursing home physician and followup can be made from there with orthopedics as needed.    DISCHARGE MEDICATIONS:  1.  Advair 250/50, 1 puff b.i.d.  2.  Vasotec 10 mg p.o. b.i.d.  3.  Combivent 1 puff q.6 hours. 4.  Vitamin B12 250 mcg p.o. daily.  5.  Ranitidine 150 mg p.o. daily.  6.  Glipizide XL 2.5 mg p.o. daily.  7.  Lorazepam 0.5 mg p.o. at bedtime.  8.  Calcium 600 mg p.o. daily.  9.  Mirtazapine 7.5 mg p.o. at bedtime.  10. Meclizine 12.5 mg p.o. t.i.d. as needed for vertigo.  11. Colace 100 mg p.o. b.i.d. as needed for constipation.  12. Lovenox 30 mg subcutaneous once a day x 10 days for DVT prophylaxis.   The patient was given instructions to hold hydrochlorothiazide at this time.   CODE STATUS: The patient is full code.   TIME SPENT: 30 minutes.  ____________________________ Adin Hector, MD bjk:aw D: 06/17/2013 07:36:22 ET T: 06/17/2013 07:44:36 ET JOB#: [id number]  cc: Adin Hector, MD, <Dictator> Ramonita Lab MD ELECTRONICALLY SIGNED 06/20/2013 8:03

## 2014-07-19 NOTE — Consult Note (Signed)
Brief Consult Note: Diagnosis: Right greater trochanter fracture.   Patient was seen by consultant.   Consult note dictated.   Recommend further assessment or treatment.   Orders entered.   Comments: Patient's greater tuberosity fracture will heal without surgery.  Recommend PT for WBAT.  No active abduction to the right hip.  Follow up in 4-6 weeks in the office.  Electronic Signatures: Thornton Park (MD)  (Signed 18-Mar-15 22:21)  Authored: Brief Consult Note   Last Updated: 18-Mar-15 22:21 by Thornton Park (MD)

## 2014-07-19 NOTE — Consult Note (Signed)
Reason for Visit: This 81 year old Female patient presents to the clinic for initial evaluation of  lung cancer .   Referred by Dr Ma Hillock.  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  Pathology Report pathology report reviewed   Imaging Report PET CT scan reviewed   Referral Report clinical notes reviewed   Planned Treatment Regimen radiation therapy with curative intent   HPI   patient is a 81 year old female with a history of breast cancer status post lumpectomy 1978 followed by radiation therapy. She developed a right lower lobe mass in 2003 [place] Beau showed adenocarcinoma stage I disease. She's done well since although recently presented with some vertigo and on CT scan was noted to have an abnormal right infrahilar mass. Underwent bronchoscopy which was positive for squamous cell carcinoma. She had PET CT scan showing hypermetabolic activity in the right infrahilar region. She also had some hypermetabolic activity in the mediastinum although radiologist are thinking this may be brown fat. She has lost about 5-10 pounds over the past year. She is fairly wheelchair-bound although walks with the assistance of a walker. She also has comorbidities including adult onset diabetes hypertension osteoporosis and history of lower back pain with compression fractures as well as COPD asthma. She is seen today for consideration of radiation with curative intent. She has a slight nonproductive cough. No hemoptysis.  Past Hx:    cardiac cath 2003:    HTN:    DM:    compression fractures:    COPD:    breast ca:    lung ca:    back surgery x 2:    lung resection:    cholecystectomy:    hysterectomy:    partial right mastectomy:   Past, Family and Social History:  Past Medical History positive   Cardiovascular cardiac catheterization; hypertension   Respiratory asthma; COPD   Endocrine diabetes  mellitus   Past Surgical History cholecystectomy; partial right mastectomy, hysterectomy, lung lobectomy as described above, back surgery x2   Family History positive   Family History Comments family history of lupus, epilepsy and Alzheimer's disease   Social History positive   Social History Comments 40-pack-year smoking history quit smoking 25 years prior, no EtOH abuse history   Additional Past Medical and Surgical History accompanied by multiple family members today   Allergies:   ASA: Anaphylaxis  Morphine: Hives  Penicillin: Other  Tape: Unknown  Contrast dye: Unknown  Home Meds:  Home Medications: Medication Instructions Status  enalapril 10 mg oral tablet 1 tab(s) orally 2 times a day Active  glipiZIDE 5 mg oral tablet 1 tab(s) orally once a day Active  HydroDIURIL '25mg'$  1 tab(s) orally once a day Active  Ativan 1 mg oral tablet 1 tab(s) orally once a day Active  gabapentin 100 mg oral capsule 1 cap(s) orally 2 times a day Active  Advair Diskus 250 mcg-50 mcg powder 1 puff(s) inhaled once a day Active  Calcarb with D 600 mg-400 intl units tablet 1 tab(s) orally 2 times a day Active  Combivent 90 mcg-18 mcg/inh aerosol with adapter 1 puff(s) inhaled every 6 hours as needed for difficulty breathing Active  Vitamin B-12 1 tab(s) orally once a day Active  ranitidine 150 mg oral capsule 1 tab(s) orally once a day Active   Review of Systems:  General negative   Performance Status (ECOG) 1   Skin negative   Breast see HPI  Ophthalmologic negative   ENMT negative   Respiratory and Thorax see HPI   Cardiovascular negative   Gastrointestinal negative   Genitourinary negative   Musculoskeletal negative   Neurological negative   Hematology/Lymphatics negative   Endocrine negative   Allergic/Immunologic negative   Review of Systems   denies any weight loss, fatigue, weakness, fever, chills or night sweats. Patient denies any loss of vision, blurred  vision. Patient denies any ringing  of the ears or hearing loss. No irregular heartbeat. Patient denies heart murmur or history of fainting. Patient denies any chest pain or pain radiating to her upper extremities. Patient denies any shortness of breath, difficulty breathing at night, cough or hemoptysis. Patient denies any swelling in the lower legs. Patient denies any nausea vomiting, vomiting of blood, or coffee ground material in the vomitus. Patient denies any stomach pain. Patient states has had normal bowel movements no significant constipation or diarrhea. Patient denies any dysuria, hematuria or significant nocturia. Patient denies any problems walking, swelling in the joints or loss of balance. Patient denies any skin changes, loss of hair or loss of weight. Patient denies any excessive worrying or anxiety or significant depression. Patient denies any problems with insomnia. Patient denies excessive thirst, polyuria, polydipsia. Patient denies any swollen glands, patient denies easy bruising or easy bleeding. Patient denies any recent infections, allergies or URI. Patient "s visual fields have not changed significantly in recent time.   Nursing Notes:  Nursing Vital Signs and Chemo Nursing Nursing Notes: *CC Vital Signs Flowsheet:   28-Sep-15 10:41  Pulse Pulse 103  Respirations Respirations 18  SBP SBP 146  DBP DBP 73  Pain Scale (0-10)  0  Current Weight (kg) (kg) 49.3   Physical Exam:  General/Skin/HEENT:  Skin normal   Eyes normal   ENMT normal   Head and Neck normal   Additional PE elderly wheelchair-bound female in NAD. Neck is clear without evidence of cervical or supraclavicular adenopathy. Lungs are clear to A&P cardiac examination shows regular rate and rhythm. Abdomen is benign. She has a partial resection of her breast with prior history of breast cancer.   Breasts/Resp/CV/GI/GU:  Respiratory and Thorax normal   Cardiovascular normal   Gastrointestinal normal    Genitourinary normal   MS/Neuro/Psych/Lymph:  Musculoskeletal normal   Neurological normal   Lymphatics normal   Other Results:  Radiology Results: LabUnknown:    27-Aug-15 11:24, PET/CT Scan Lung Cancer Restaging  PACS Image   Nuclear Med:  PET/CT Scan Lung Cancer Restaging   REASON FOR EXAM:    Abn Tissue RLL on Chest CT Hx Lung CA Adenocarcinoma  COMMENTS:       PROCEDURE: PET - PET/CT RESTAGING LUNG CA  - Nov 21 2013 11:24AM     CLINICAL DATA:  Subsequent treatment strategy for lung cancer.  History of adenocarcinoma of the lung.Marland Kitchen    EXAM:  NUCLEAR MEDICINE PET SKULL BASE TO THIGH    TECHNIQUE:  12.6 mCi F-18 FDG was injected intravenously. Full-ring PET imaging  was performed from the skull base to thigh after the radiotracer. CT  data was obtained and used for attenuation correction and anatomic  localization.    FASTING BLOOD GLUCOSE:  Value: 121 mg/dl    COMPARISON:  CT 11/08/2013    FINDINGS:  NECK    No hypermetabolic lymph nodes in the neck.    CHEST    There is a 3.9 x 3.3 cm right infrahilar mass (image 97 series 3)  with intense  metabolic activity (SUV max 9.1). The metabolic  activity corresponds to the mass which extends into the right main  pulmonary artery on comparison CT.    No clear hypermetabolic mediastinal lymph nodes. There is  hypermetabolic tissue positioned between the left and right atria  (image 91) favored to represent hypermetabolic fat. There several  foci of hypermetabolic fat along the paraspinal fat in the thorax.    There is a small hypermetabolic nodular focus within the lingula  with SUV max equal 1.3. This lesion measures 16 x 10 mm (image 94,  series 3) compared to 13 x 10 mm on CT of 11/08/2013.    ABDOMEN/PELVIS    No abnormal hypermetabolic activity within the liver, pancreas,  adrenal glands, or spleen. No hypermetabolic lymph nodes in the  abdomen or pelvis. Large gallstone within the  gallbladder.    SKELETON    Metabolic activity associated with the vertebroplasty cement of the  lumbar spine. No uptake to suggest metastatic disease.     IMPRESSION:  1. Hypermetabolic right infrahilar masses extends to the right main  pulmonary artery consists with lung cancer recurrence.  2. No clear hypermetabolic mediastinal lymph nodes.  3. Hypermetabolic nodule within the lingula. Differential would  include a focus of infection versus early neoplasm. Recommend  attention on follow-up.  4. No evidence of distant metastasis.  5. Hypermetabolic brown fat in the paraspinal locations and within  the pericardiac mediastinum.      Electronically Signed    By: Suzy Bouchard M.D.    On: 11/21/2013 13:11         Verified By: Rennis Golden, M.D.,   Relevent Results:   Relevant Scans and Labs PET/CT scans are reviewed   Assessment and Plan: Impression:   stage IIB squamous cell carcinoma right hilum in 81 year old female with prior history of right lower lobectomy for adenocarcinoma and history of breast cancer remotely Plan:   I've gone over this case with medical oncology. Based on her advanced age she is not a candidate for chemotherapy. I would go ahead with radiation therapy with curative intent. We'll plan on delivering 7000 cGy over 7 weeks since chemotherapy will not be used concurrently. Based on her COPD,her prior history lobectomy, positioning of her tumor, would prefer to use IM RT treatment planning and delivery to spare as much normal lung volume as possible. Risks and benefits of treatment including possible dysphasia, possible decrease in pulmonary function, alteration of blood counts, skin reaction, all were explained in detail to the patient and her family. They all seem to comprehend my treatment plan well. Patient is a little reluctant for treatment although I have ordered and set her up for CT simulation later this week. I discussed the case personally with  medical oncology.  I would like to take this opportunity for allowing me to participate in the care of your patient..  Fax to Physician:  Physicians To Recieve Fax: Tama High, MD - [phone].  Electronic Signatures: Elya Tarquinio, Roda Shutters (MD)  (Signed 28-Sep-15 12:39)  Authored: HPI, Diagnosis, Past Hx, PFSH, Allergies, Home Meds, ROS, Nursing Notes, Physical Exam, Other Results, Relevent Results, Encounter Assessment and Plan, Fax to Physician   Last Updated: 28-Sep-15 12:39 by Armstead Peaks (MD)

## 2014-07-21 LAB — SURGICAL PATHOLOGY

## 2014-08-14 DIAGNOSIS — C3410 Malignant neoplasm of upper lobe, unspecified bronchus or lung: Secondary | ICD-10-CM | POA: Insufficient documentation

## 2014-08-21 ENCOUNTER — Ambulatory Visit
Admission: RE | Admit: 2014-08-21 | Discharge: 2014-08-21 | Disposition: A | Discharge status: Home / Self Care | Payer: Medicare Other | Source: Ambulatory Visit | Attending: Radiation Oncology | Admitting: Radiation Oncology

## 2014-08-21 ENCOUNTER — Other Ambulatory Visit: Payer: Self-pay | Admitting: *Deleted

## 2014-08-21 ENCOUNTER — Encounter: Payer: Self-pay | Admitting: Radiation Oncology

## 2014-08-21 VITALS — BP 131/81 | HR 112 | Temp 98.2°F | Resp 21 | Ht 60.0 in | Wt 101.6 lb

## 2014-08-21 DIAGNOSIS — Z85118 Personal history of other malignant neoplasm of bronchus and lung: Secondary | ICD-10-CM

## 2014-08-21 DIAGNOSIS — C3401 Malignant neoplasm of right main bronchus: Secondary | ICD-10-CM

## 2014-08-21 NOTE — Progress Notes (Signed)
Patient referred back today by Dr. Ramonita Lab due to a new area on her lung .   She denies any coughing, or SOB.

## 2014-08-21 NOTE — Progress Notes (Signed)
Radiation Oncology Follow up Note  Name: Tammy Molina   Date:   08/21/2014 MRN:  [id number] DOB: 08-29-1923    This 81 y.o. female presents to the clinic today forpatient is a 81 year old female with a history of breast cancer status post lumpectomy 1978 followed by radiation therapy. She developed a right lower lobe mass in 2003 [place] showed adenocarcinoma stage I disease. She's done well since although recently presented with some vertigo and on CT scan was noted to have an abnormal right infrahilar mass. Underwent bronchoscopy which was positive for squamous cell carcinoma. She had PET CT scan showing hypermetabolic activity in the right infrahilar region. She also had some hypermetabolic activity in the mediastinum although radiologist are thinking this may be brown fat. She has lost about 5-10 pounds over the past year. She is fairly wheelchair-bound although walks with the assistance of a walker. She also has comorbidities including adult onset diabetes hypertension osteoporosis and history of lower back pain with compression fractures as well as COPD asthma.  She underwent 7000 cGy to the area of recurrence and has done well. Is completing therapy approximate 7 months prior. She recently had a biopsy for breast mass which turned out to be benign. Recently had CT scan performed by  showing 2 new masses within the left lung which have increased in size in the interval since her last CT scan. She also has 2 new pleural-based masses within the right lung base which on my review certainly can be related to her prior surgery. She is asymptomatic specifically denies marked shortness of breath dyspnea on exertion dysphagia cough or hemoptysis.  REFERRING PROVIDER: No ref. provider found  HPI: As above.  COMPLICATIONS OF TREATMENT: none  FOLLOW UP COMPLIANCE: keeps appointments   PHYSICAL EXAM:  BP 131/81 mmHg  Pulse 112  Temp(Src) 98.2 F (36.8 C)  Resp 21  Ht 5' (1.524 m)  Wt 101 lb 10.1  oz (46.1 kg)  BMI 19.85 kg/m2 Thin female in NAD no cervical or supra or adenopathy is identified lungs are clear to A&P cardiac examination shows regular rate and rhythm. Well-developed well-nourished patient in NAD. HEENT reveals PERLA, EOMI, discs not visualized.  Oral cavity is clear. No oral mucosal lesions are identified. Neck is clear without evidence of cervical or supraclavicular adenopathy. Lungs are clear to A&P. Cardiac examination is essentially unremarkable with regular rate and rhythm without murmur rub or thrill. Abdomen is benign with no organomegaly or masses noted. Motor sensory and DTR levels are equal and symmetric in the upper and lower extremities. Cranial nerves II through XII are grossly intact. Proprioception is intact. No peripheral adenopathy or edema is identified. No motor or sensory levels are noted. Crude visual fields are within normal range.   RADIOLOGY RESULTS: Recent CT scan is reviewed and compared to her prior head CT scans  PLAN: At this time I'm running a PET CT on her for better delineation of exactly how much disease we are dealing with in her chest. The 2 new lesions in her left lung could be amenable to SB RT treatments which would be 5000 cGy in 5 fractions. We'll also have medical oncology see the patient for continued follow-up care. I have ordered a PET CT scan and will see the patient shortly thereafter for discussion.  I would like to take this opportunity for allowing me to participate in the care of your patient.Armstead Peaks., MD

## 2014-08-27 ENCOUNTER — Ambulatory Visit
Admission: RE | Admit: 2014-08-27 | Discharge: 2014-08-27 | Disposition: A | Discharge status: Home / Self Care | Payer: Medicare Other | Source: Ambulatory Visit | Attending: Radiation Oncology | Admitting: Radiation Oncology

## 2014-08-27 DIAGNOSIS — K449 Diaphragmatic hernia without obstruction or gangrene: Secondary | ICD-10-CM | POA: Insufficient documentation

## 2014-08-27 DIAGNOSIS — C3490 Malignant neoplasm of unspecified part of unspecified bronchus or lung: Secondary | ICD-10-CM | POA: Insufficient documentation

## 2014-08-27 DIAGNOSIS — N281 Cyst of kidney, acquired: Secondary | ICD-10-CM | POA: Insufficient documentation

## 2014-08-27 DIAGNOSIS — K8020 Calculus of gallbladder without cholecystitis without obstruction: Secondary | ICD-10-CM | POA: Diagnosis not present

## 2014-08-27 DIAGNOSIS — Z85118 Personal history of other malignant neoplasm of bronchus and lung: Secondary | ICD-10-CM

## 2014-08-27 LAB — GLUCOSE, CAPILLARY: Glucose-Capillary: 124 mg/dL — ABNORMAL HIGH (ref 65–99)

## 2014-08-27 MED ORDER — FLUDEOXYGLUCOSE F - 18 (FDG) INJECTION
13.2000 | Freq: Once | INTRAVENOUS | Status: AC | PRN
  Administered 2014-08-27: 13.2 via INTRAVENOUS

## 2014-09-03 ENCOUNTER — Encounter: Payer: Self-pay | Admitting: Radiation Oncology

## 2014-09-03 ENCOUNTER — Inpatient Hospital Stay: Payer: Medicare Other | Attending: Internal Medicine | Admitting: Internal Medicine

## 2014-09-03 ENCOUNTER — Ambulatory Visit
Admission: RE | Admit: 2014-09-03 | Discharge: 2014-09-03 | Disposition: A | Discharge status: Home / Self Care | Payer: Medicare Other | Source: Ambulatory Visit | Attending: Radiation Oncology | Admitting: Radiation Oncology

## 2014-09-03 VITALS — BP 153/75 | HR 120 | Resp 18 | Wt 101.3 lb

## 2014-09-03 DIAGNOSIS — C771 Secondary and unspecified malignant neoplasm of intrathoracic lymph nodes: Secondary | ICD-10-CM | POA: Diagnosis not present

## 2014-09-03 DIAGNOSIS — I10 Essential (primary) hypertension: Secondary | ICD-10-CM | POA: Insufficient documentation

## 2014-09-03 DIAGNOSIS — M810 Age-related osteoporosis without current pathological fracture: Secondary | ICD-10-CM | POA: Diagnosis not present

## 2014-09-03 DIAGNOSIS — C3401 Malignant neoplasm of right main bronchus: Secondary | ICD-10-CM

## 2014-09-03 DIAGNOSIS — Z85118 Personal history of other malignant neoplasm of bronchus and lung: Secondary | ICD-10-CM | POA: Insufficient documentation

## 2014-09-03 DIAGNOSIS — Z79899 Other long term (current) drug therapy: Secondary | ICD-10-CM | POA: Insufficient documentation

## 2014-09-03 DIAGNOSIS — Z853 Personal history of malignant neoplasm of breast: Secondary | ICD-10-CM | POA: Diagnosis not present

## 2014-09-03 DIAGNOSIS — J449 Chronic obstructive pulmonary disease, unspecified: Secondary | ICD-10-CM | POA: Insufficient documentation

## 2014-09-03 DIAGNOSIS — Z923 Personal history of irradiation: Secondary | ICD-10-CM | POA: Diagnosis not present

## 2014-09-03 DIAGNOSIS — E119 Type 2 diabetes mellitus without complications: Secondary | ICD-10-CM | POA: Insufficient documentation

## 2014-09-03 DIAGNOSIS — R5382 Chronic fatigue, unspecified: Secondary | ICD-10-CM | POA: Insufficient documentation

## 2014-09-03 DIAGNOSIS — Z87891 Personal history of nicotine dependence: Secondary | ICD-10-CM | POA: Diagnosis not present

## 2014-09-03 DIAGNOSIS — C7802 Secondary malignant neoplasm of left lung: Secondary | ICD-10-CM | POA: Insufficient documentation

## 2014-09-03 DIAGNOSIS — C3431 Malignant neoplasm of lower lobe, right bronchus or lung: Secondary | ICD-10-CM | POA: Diagnosis not present

## 2014-09-03 NOTE — Progress Notes (Signed)
Radiation Oncology Follow up Note  Name: Tammy Molina   Date:   09/03/2014 MRN:  [id number] DOB: 1924-01-25    This 81 y.o. female presents to the clinic today for lung cancer follow-up.  REFERRING PROVIDER: Adin Hector, MD  HPI: Patient is an 81 year old female status post recent therapy for breast cancer back in 1978. In 2003 she developed stage I adenocarcinoma the right lower lobe. Recently presented with vertigo CT scan showed an abnormal right infrahilar mass bronchial positive for squamous cell carcinoma underwent 7000 cGy to this area. Recently had a new CT scan showing 2 new masses within the left lung which is increased in size. She also had a pleural-based mass in the right lung base. I ordered a PET CT scan which showed significant progression of disease by PET CT criteria with multiple hypermetabolic left lung lesions extensive pleural spread of tumor. She also had hypermetabolic activity in the right lower lobe pleural-based which could be eroding the external cortex of the pedicle and is adjacent to the rib. Her major complaint is right hip pain although no evidence of hypermetabolic activity on PET CT in that area is noted. I discussed the case with medical oncology who had she has seen before and she is to be via evaluated today for possibility of targeted therapy. She seen today to discuss the PET/CT results.  COMPLICATIONS OF TREATMENT: none  FOLLOW UP COMPLIANCE: keeps appointments   PHYSICAL EXAM:  BP 153/75 mmHg  Pulse 120  Resp 18  Wt 101 lb 4.8 oz (45.95 kg) Elderly female in NAD. Lungs are clear to A&P cardiac examination shows regular rate and rhythm. Range of motion of her right lower extremity does not elicit pain. Motor and sensory levels in the lower extremities are equal and symmetric bilaterally. Well-developed well-nourished patient in NAD. HEENT reveals PERLA, EOMI, discs not visualized.  Oral cavity is clear. No oral mucosal lesions are identified.  Neck is clear without evidence of cervical or supraclavicular adenopathy. Lungs are clear to A&P. Cardiac examination is essentially unremarkable with regular rate and rhythm without murmur rub or thrill. Abdomen is benign with no organomegaly or masses noted. Motor sensory and DTR levels are equal and symmetric in the upper and lower extremities. Cranial nerves II through XII are grossly intact. Proprioception is intact. No peripheral adenopathy or edema is identified. No motor or sensory levels are noted. Crude visual fields are within normal range.   RADIOLOGY RESULTS: PET CT scans are reviewed  PLAN: As above she will be seeing medical oncology today for discussion of possible targeted therapies. I discussed the case personally with medical oncology. I've explained the situation to the patient and her family member. Should she need palliative radiation therapy for any significant problems secondary to her progressive disease will be happy to reevaluate her at any time.  I would like to take this opportunity for allowing me to participate in the care of your patient.Armstead Peaks., MD

## 2014-09-03 NOTE — Progress Notes (Signed)
Patient here today for the results of a PET scn completed on 08/27/14.

## 2014-09-16 ENCOUNTER — Other Ambulatory Visit: Payer: Self-pay

## 2014-09-17 ENCOUNTER — Inpatient Hospital Stay (HOSPITAL_BASED_OUTPATIENT_CLINIC_OR_DEPARTMENT_OTHER): Payer: Medicare Other | Admitting: Internal Medicine

## 2014-09-17 ENCOUNTER — Inpatient Hospital Stay: Payer: Medicare Other

## 2014-09-17 ENCOUNTER — Encounter: Payer: Self-pay | Admitting: Internal Medicine

## 2014-09-17 VITALS — BP 137/70 | HR 112 | Temp 98.1°F | Resp 18 | Ht 60.0 in | Wt 102.2 lb

## 2014-09-17 DIAGNOSIS — M545 Low back pain, unspecified: Secondary | ICD-10-CM | POA: Insufficient documentation

## 2014-09-17 DIAGNOSIS — C7802 Secondary malignant neoplasm of left lung: Secondary | ICD-10-CM

## 2014-09-17 DIAGNOSIS — Z923 Personal history of irradiation: Secondary | ICD-10-CM

## 2014-09-17 DIAGNOSIS — C3431 Malignant neoplasm of lower lobe, right bronchus or lung: Secondary | ICD-10-CM

## 2014-09-17 DIAGNOSIS — E538 Deficiency of other specified B group vitamins: Secondary | ICD-10-CM | POA: Insufficient documentation

## 2014-09-17 DIAGNOSIS — Z87891 Personal history of nicotine dependence: Secondary | ICD-10-CM

## 2014-09-17 DIAGNOSIS — I10 Essential (primary) hypertension: Secondary | ICD-10-CM

## 2014-09-17 DIAGNOSIS — C771 Secondary and unspecified malignant neoplasm of intrathoracic lymph nodes: Secondary | ICD-10-CM | POA: Diagnosis not present

## 2014-09-17 DIAGNOSIS — Z85118 Personal history of other malignant neoplasm of bronchus and lung: Secondary | ICD-10-CM | POA: Diagnosis not present

## 2014-09-17 DIAGNOSIS — C3490 Malignant neoplasm of unspecified part of unspecified bronchus or lung: Secondary | ICD-10-CM | POA: Insufficient documentation

## 2014-09-17 DIAGNOSIS — J449 Chronic obstructive pulmonary disease, unspecified: Secondary | ICD-10-CM

## 2014-09-17 DIAGNOSIS — Z853 Personal history of malignant neoplasm of breast: Secondary | ICD-10-CM

## 2014-09-17 DIAGNOSIS — E119 Type 2 diabetes mellitus without complications: Secondary | ICD-10-CM

## 2014-09-17 DIAGNOSIS — I2510 Atherosclerotic heart disease of native coronary artery without angina pectoris: Secondary | ICD-10-CM | POA: Insufficient documentation

## 2014-09-17 DIAGNOSIS — G64 Other disorders of peripheral nervous system: Secondary | ICD-10-CM | POA: Insufficient documentation

## 2014-09-17 DIAGNOSIS — IMO0002 Reserved for concepts with insufficient information to code with codable children: Secondary | ICD-10-CM | POA: Insufficient documentation

## 2014-09-17 DIAGNOSIS — C50919 Malignant neoplasm of unspecified site of unspecified female breast: Secondary | ICD-10-CM | POA: Insufficient documentation

## 2014-09-17 DIAGNOSIS — M810 Age-related osteoporosis without current pathological fracture: Secondary | ICD-10-CM

## 2014-09-17 DIAGNOSIS — R5382 Chronic fatigue, unspecified: Secondary | ICD-10-CM

## 2014-09-17 DIAGNOSIS — G8929 Other chronic pain: Secondary | ICD-10-CM | POA: Insufficient documentation

## 2014-09-17 DIAGNOSIS — C3491 Malignant neoplasm of unspecified part of right bronchus or lung: Secondary | ICD-10-CM

## 2014-09-17 DIAGNOSIS — Z79899 Other long term (current) drug therapy: Secondary | ICD-10-CM

## 2014-09-17 LAB — BASIC METABOLIC PANEL
ANION GAP: 8 (ref 5–15)
BUN: 27 mg/dL — ABNORMAL HIGH (ref 6–20)
CALCIUM: 8.9 mg/dL (ref 8.9–10.3)
CO2: 26 mmol/L (ref 22–32)
CREATININE: 1.02 mg/dL — AB (ref 0.44–1.00)
Chloride: 104 mmol/L (ref 101–111)
GFR calc Af Amer: 55 mL/min — ABNORMAL LOW (ref >60)
GFR, EST NON AFRICAN AMERICAN: 48 mL/min — AB (ref >60)
Glucose, Bld: 93 mg/dL (ref 65–99)
Potassium: 4.5 mmol/L (ref 3.5–5.1)
Sodium: 138 mmol/L (ref 135–145)

## 2014-09-17 LAB — CBC WITH DIFFERENTIAL/PLATELET
Basophils Absolute: 0 10*3/uL (ref 0–0.1)
Basophils Relative: 1 %
EOS PCT: 3 %
Eosinophils Absolute: 0.3 10*3/uL (ref 0–0.7)
HEMATOCRIT: 35.2 % (ref 35.0–47.0)
Hemoglobin: 10.9 g/dL — ABNORMAL LOW (ref 12.0–16.0)
LYMPHS ABS: 0.8 10*3/uL — AB (ref 1.0–3.6)
LYMPHS PCT: 10 %
MCH: 25.6 pg — ABNORMAL LOW (ref 26.0–34.0)
MCHC: 31.1 g/dL — ABNORMAL LOW (ref 32.0–36.0)
MCV: 82.2 fL (ref 80.0–100.0)
MONO ABS: 0.7 10*3/uL (ref 0.2–0.9)
Monocytes Relative: 8 %
Neutro Abs: 6.3 10*3/uL (ref 1.4–6.5)
Neutrophils Relative %: 78 %
Platelets: 367 10*3/uL (ref 150–440)
RBC: 4.28 MIL/uL (ref 3.80–5.20)
RDW: 19.2 % — ABNORMAL HIGH (ref 11.5–14.5)
WBC: 8 10*3/uL (ref 3.6–11.0)

## 2014-09-17 LAB — HEPATIC FUNCTION PANEL
ALT: 12 U/L — ABNORMAL LOW (ref 14–54)
AST: 20 U/L (ref 15–41)
Albumin: 3.6 g/dL (ref 3.5–5.0)
Alkaline Phosphatase: 64 U/L (ref 38–126)
Total Bilirubin: 0.4 mg/dL (ref 0.3–1.2)
Total Protein: 7.1 g/dL (ref 6.5–8.1)

## 2014-09-21 NOTE — Progress Notes (Signed)
[place]  Telephone:(336) [phone] Fax:(336) [phone]     ID: Tammy Molina OB: March 11, 1924  MR#: [id number]  JYN#:[id number]  Patient Care Team: Adin Hector, MD as PCP - General (Internal Medicine)  CHIEF COMPLAINT/DIAGNOSIS:  Recurrent Non-Small cell Right Lung Cancer. Bronchoscopy/biopsy on 12/13/13 RIGHT LOWER LOBE POSITIVE FOR MALIGNANT CELLS. SQUAMOUS CELL CARCINOMA PRESENT. Aug 2015 - CT chest reported right infrahilar mass and mediastinal adenopathy PET-positive. PET scan 11/21/13 also reported faintly positive left lingula lesion. Known prior history of right lower lobe non small cell lung cancer tatus post surgical resection on 2003.  08/27/14 - PET scan. IMPRESSION: 1. Significant progression of disease involving the chest. There are hypermetabolic left lung lesions and extensive pleural spread of tumor as described above. 2. Hypermetabolic fat between the right and left atria.  HISTORY OF PRESENT ILLNESS:  patient returns for continued oncology followup. She had recent PET scan which is reporting evidence of recurrent lung cancer. States that she is otherwise doing about the same, has chronic fatigue and dyspnea on exertion and ambulates slowly. Denies any progressive cough, chest pain or hemoptysis. Appetite is good but slowly losing weight. No new headaches, imbalance or falls.    REVIEW OF SYSTEMS:   ROS As in HPI above. In addition, no fever, chills or sweats. No new headaches or focal weakness.  No sore throat or dysphagia. No dizziness or palpitation. No abdominal pain, constipation, diarrhea, dysuria or hematuria. No new skin rash or bleeding symptoms. No new paresthesias in extremities. PS ECOG 2.  PAST MEDICAL HISTORY: Reviewed. Past Medical History  Diagnosis Date  . Hypertension   . Diabetes mellitus without complication   . COPD (chronic obstructive pulmonary disease)   . Breast cancer   . Lung cancer   . Compression fracture    Diabetes mellitus  Hypertension  Osteoporosis  Chronic low back pain with history of compression fractures  COPD/asthma  Recurrent falls secondary to Vertigo per patient  History of breast cancer status post lumpectomy in 1978 followed by radiation  Right lower lobe lobectomy in May 2003 for non-small cell lung cancer (bronchoscopy showed adenocarcinoma, stage I disease per patient).   Hysterectomy  Cholecystectomy  Back surgery x2  PAST SURGICAL HISTORY: Reviewed. Past Surgical History  Procedure Laterality Date  . Back surgery    . Lung removal, partial    . Breast lumpectomy Right     FAMILY HISTORY: Reviewed. Denies malignancy.  Remarkable for lupus, epilepsy and Alzheimer's disease.  ADVANCED DIRECTIVES:  No - patient declined information  SOCIAL HISTORY: Reviewed. History  Substance Use Topics  . Smoking status: Former Smoker -- 1.00 packs/day for 30 years    Types: Cigarettes  . Smokeless tobacco: Never Used  . Alcohol Use: No  Ex smoker, quit in 1994.  Denies alcohol use.  Ambulates slowly.  Allergies  Allergen Reactions  . Asa [Aspirin] Anaphylaxis  . Alendronate Sodium     Other reaction(s): Other (See Comments) Made her feel nervous  . Fluticasone Propionate     Other reaction(s): Other (See Comments) Nose bleeds  . Ibuprofen Itching  . Ivp Dye [Iodinated Diagnostic Agents] Other (See Comments)    Unknown  . Metformin Hcl Diarrhea  . Morphine And Related Hives  . Penicillins Other (See Comments)    Unknown   . Tape     Current Outpatient Prescriptions  Medication Sig Dispense Refill  . ADVAIR DISKUS 250-50 MCG/DOSE AEPB     . azithromycin (ZITHROMAX) 250  MG tablet     . calcium carbonate (OS-CAL) 600 MG tablet Take by mouth.    . COMBIVENT RESPIMAT 20-100 MCG/ACT AERS respimat     . Cyanocobalamin 1000 MCG TBCR Take by mouth.    . enalapril (VASOTEC) 10 MG tablet     . gabapentin (NEURONTIN) 100 MG capsule     . glipiZIDE (GLUCOTROL XL) 5 MG  24 hr tablet     . hydrochlorothiazide (HYDRODIURIL) 25 MG tablet     . Ipratropium-Albuterol (COMBIVENT) 20-100 MCG/ACT AERS respimat Inhale into the lungs.    Marland Kitchen LORazepam (ATIVAN) 1 MG tablet     . ranitidine (ZANTAC) 150 MG tablet      No current facility-administered medications for this visit.    PHYSICAL EXAM: There were no vitals filed for this visit.   There is no weight on file to calculate BMI.    ECOG FS:2 - Symptomatic, <50% confined to bed  GENERAL: Patient is alert and oriented and in no acute distress. There is no icterus. HEENT: EOMs intact. Oral exam negative for thrush or lesions. No cervical lymphadenopathy. CVS: S1S2, regular LUNGS: Bilaterally clear to auscultation, no rhonchi. ABDOMEN: Soft, nontender. No hepatosplenomegaly clinically.  EXTREMITIES: No pedal edema   LAB RESULTS: 08/27/14 - PET scan. IMPRESSION:  1. Significant progression of disease involving the chest. There are hypermetabolic left lung lesions and extensive pleural spread of tumor as described above.  2. Hypermetabolic fat between the right and left atria.  STUDIES: Nm Pet Image Restag (ps) Skull Base To Thigh  08/27/2014   CLINICAL DATA:  Subsequent treatment strategy for lung cancer. New lesions seen on recent chest CT  EXAM: NUCLEAR MEDICINE PET SKULL BASE TO THIGH  TECHNIQUE: 13.2 mCi F-18 FDG was injected intravenously. Full-ring PET imaging was performed from the skull base to thigh after the radiotracer. CT data was obtained and used for attenuation correction and anatomic localization.  FASTING BLOOD GLUCOSE:  Value: 124 mg/dl  COMPARISON:  Chest CT 07/22/2014 and PET-CT 11/21/2013  FINDINGS: NECK  No hypermetabolic lymph nodes in the neck.  CHEST  Significant progression of disease is noted in the chest.  14.5 mm left lower lobe pulmonary nodule is hypermetabolic with SUV max of 6.9.  2.7 cm spiculated lesion in the lingula is hypermetabolic with SUV max of 4.1.  Anterior pleural mass on the  right side on image 99 is hypermetabolic with SUV max of 4.9.  2.8 cm pleural mass in the right lower lobe which appears to be eroding the external cortex of the pedicle and also the adjacent rib. This is hypermetabolic with SUV max of 5.9.  Pleural-based mass at the right lung base just above the liver measures 18 mm and is hypermetabolic with SUV max of 6.8.  Vague hypermetabolism is noted involving the intra-atrial fat. Could not exclude infiltrating tumor but this also could be hypermetabolic fat.  Hypermetabolic tumor is also noted between the right lower ribs and the liver. SUV max is 5.4  No findings for recurrent right hilar tumor.  ABDOMEN/PELVIS  No abnormal hypermetabolic activity within the liver, pancreas, adrenal glands, or spleen. No hypermetabolic lymph nodes in the abdomen or pelvis.  Cholelithiasis noted along with stable renal cysts. There is a large hiatal hernia.  SKELETON  No focal hypermetabolic activity to suggest skeletal metastasis.  IMPRESSION: 1. Significant progression of disease involving the chest. There are hypermetabolic left lung lesions and extensive pleural spread of tumor as described above. 2. Hypermetabolic fat  between the right and left atria.   Electronically Signed   By: Marijo Sanes M.D.   On: 08/27/2014 13:48    STAGING: No matching staging information was found for the patient.   ASSESSMENT / PLAN:   Recurrent Non-Small cell Right Lung Cancer. Bronchoscopy/biopsy on 12/13/13 RIGHT LOWER LOBE POSITIVE FOR MALIGNANT CELLS. SQUAMOUS CELL CARCINOMA PRESENT. Aug 2015 - CT chest reported right infrahilar mass and mediastinal adenopathy PET-positive. PET scan 11/21/13 also reported faintly positive left lingula lesion. Known prior history of right lower lobe non small cell lung cancer s/p surgical resection on 2003. Remote h/o breast cancer in 1978  -   Have independently reviewed PET scan and d/w patient and family present, and explained that radiologically there is  evidence of recurrent lung Ca with metastases in left lung. She is not interested in pursuing repeat biopsy to confirn, becurrence with tissue diagnosis, and understands that she has evidence of recurrent stage IV lung Ca which is incurable and treatments offered are with palliative intent only and that overall prognosis is poor. Have discussed options including chemotherapy (for which she is a poor candidate) versus targeted therapy like Opdivo versus palliative care/hospice. She does not want chemo but will consider Opdivo, but wants to wait couple of weeks before starting. Will see her back in 2 weeks and start treatment if agreeable.  In between visits, the patient has been advised to call or come to the ER in case of fevers, chills, bleeding, acute sickness or new symptoms.  She is agreeable to this plan.   Leia Alf, MD   09/21/2014 2:39 PM

## 2014-09-28 NOTE — Progress Notes (Signed)
[place]  Telephone:(336) [phone] Fax:(336) [phone]     ID: Tammy Molina OB: November 06, 1923  MR#: [id number]  QMG#:[id number]  Patient Care Team: Adin Hector, MD as PCP - General (Internal Medicine)  CHIEF COMPLAINT/DIAGNOSIS:  Recurrent Non-Small cell Right Lung Cancer. Bronchoscopy/biopsy on 12/13/13 RIGHT LOWER LOBE POSITIVE FOR MALIGNANT CELLS. SQUAMOUS CELL CARCINOMA PRESENT. Aug 2015 - CT chest reported right infrahilar mass and mediastinal adenopathy PET-positive. PET scan 11/21/13 also reported faintly positive left lingula lesion. Known prior history of right lower lobe non small cell lung cancer tatus post surgical resection on 2003.  08/27/14 - PET scan. IMPRESSION: 1. Significant progression of disease involving the chest. There are hypermetabolic left lung lesions and extensive pleural spread of tumor as described above. 2. Hypermetabolic fat between the right and left atria.  HISTORY OF PRESENT ILLNESS:  patient returns for continued oncology followup and plan starting Opdivo for lung cancer. States that she is doing steady, has chronic fatigue and dyspnea on exertion and ambulates slowly. Denies any progressive cough, chest pain or hemoptysis. Appetite is good but slowly losing weight. No new headaches or falls. No fevers.  REVIEW OF SYSTEMS:   ROS As in HPI above. In addition, no new headaches or focal weakness.  No sore throat or dysphagia. No abdominal pain, constipation, diarrhea, dysuria or hematuria. No new skin rash or bleeding symptoms. No new paresthesias in extremities. PS ECOG 2.  PAST MEDICAL HISTORY: Reviewed. Past Medical History  Diagnosis Date  . Hypertension   . Diabetes mellitus without complication   . COPD (chronic obstructive pulmonary disease)   . Breast cancer   . Lung cancer   . Compression fracture           Diabetes mellitus  Hypertension  Osteoporosis  Chronic low back pain with history of compression  fractures  COPD/asthma  Recurrent falls secondary to Vertigo per patient  History of breast cancer status post lumpectomy in 1978 followed by radiation  Right lower lobe lobectomy in May 2003 for non-small cell lung cancer (bronchoscopy showed adenocarcinoma, stage I disease per patient).   Hysterectomy  Cholecystectomy  Back surgery x2  PAST SURGICAL HISTORY: Reviewed. Past Surgical History  Procedure Laterality Date  . Back surgery    . Lung removal, partial    . Breast lumpectomy Right     FAMILY HISTORY: Reviewed. Denies malignancy.  Remarkable for lupus, epilepsy and Alzheimer's disease.  ADVANCED DIRECTIVES:  No - patient declined information  SOCIAL HISTORY: Reviewed. History  Substance Use Topics  . Smoking status: Former Smoker -- 1.00 packs/day for 30 years    Types: Cigarettes  . Smokeless tobacco: Never Used  . Alcohol Use: No  Ex smoker, quit in 1994.  Denies alcohol use.  Ambulates slowly.  Allergies  Allergen Reactions  . Asa [Aspirin] Anaphylaxis  . Alendronate Sodium     Other reaction(s): Other (See Comments) Made her feel nervous  . Fluticasone Propionate     Other reaction(s): Other (See Comments) Nose bleeds  . Ibuprofen Itching  . Ivp Dye [Iodinated Diagnostic Agents] Other (See Comments)    Unknown  . Metformin Hcl Diarrhea  . Morphine And Related Hives  . Penicillins Other (See Comments)    Unknown   . Tape     Current Outpatient Prescriptions  Medication Sig Dispense Refill  . ADVAIR DISKUS 250-50 MCG/DOSE AEPB     . calcium carbonate (OS-CAL) 600 MG tablet Take by mouth.    Golden Hurter  RESPIMAT 20-100 MCG/ACT AERS respimat     . Cyanocobalamin 1000 MCG TBCR Take by mouth.    . enalapril (VASOTEC) 10 MG tablet     . gabapentin (NEURONTIN) 100 MG capsule     . glipiZIDE (GLUCOTROL XL) 5 MG 24 hr tablet     . hydrochlorothiazide (HYDRODIURIL) 25 MG tablet     . Ipratropium-Albuterol (COMBIVENT) 20-100 MCG/ACT AERS respimat Inhale  into the lungs.    Marland Kitchen LORazepam (ATIVAN) 1 MG tablet     . azithromycin (ZITHROMAX) 250 MG tablet     . ranitidine (ZANTAC) 150 MG tablet      No current facility-administered medications for this visit.    PHYSICAL EXAM: Filed Vitals:   09/17/14 1352  BP: 137/70  Pulse: 112  Temp: 98.1 F (36.7 C)  Resp: 18     Body mass index is 19.96 kg/(m^2).    ECOG FS:2 - Symptomatic, <50% confined to bed  GENERAL: Alert and oriented and in no acute distress. No icterus. HEENT: EOMs intact. No cervical lymphadenopathy. CVS: S1S2, regular LUNGS: Bilaterally clear to auscultation, no creps or rhonchi. ABDOMEN: Soft, nontender.    EXTREMITIES: No pedal edema   LAB RESULTS: 08/27/14 - PET scan. IMPRESSION:  1. Significant progression of disease involving the chest. There are hypermetabolic left lung lesions and extensive pleural spread of tumor as described above.  2. Hypermetabolic fat between the right and left atria.   ASSESSMENT / PLAN:   1. Recurrent Non-Small cell Right Lung Cancer. Bronchoscopy/biopsy on 12/13/13 RIGHT LOWER LOBE POSITIVE FOR MALIGNANT CELLS. SQUAMOUS CELL CARCINOMA PRESENT. Aug 2015 - CT chest reported right infrahilar mass and mediastinal adenopathy PET-positive. PET scan 11/21/13 also reported faintly positive left lingula lesion. Known prior history of right lower lobe non small cell lung cancer s/p surgical resection on 2003. Remote h/o breast cancer in 1978. PET scan on 08/27/14 reported evidence of recurrent lung Ca with metastases in left lung. She is not interested in pursuing repeat biopsy to confirm recurrence with tissue diagnosis, and understands that she has evidence of recurrent stage IV lung Ca which is incurable and treatments offered are with palliative intent only and that overall prognosis is poor. Have discussed options including chemotherapy (for which she is a poor candidate) versus targeted therapy like Opdivo versus palliative care/hospice. She had decided to  pursue Opdivo therapy but today states she is not sure if she wants to take treatment and still wants to think about it. States she will call us if she wants to take treatment.  2. Pain - no pain issues at this time.  3. In between visits, the patient has been advised to call or come to the ER in case of fevers, bleeding, acute sickness or new symptoms.  She is agreeable to this plan.   Leia Alf, MD   09/28/2014 11:28 PM

## 2014-09-30 ENCOUNTER — Other Ambulatory Visit: Payer: Self-pay

## 2014-09-30 DIAGNOSIS — C3490 Malignant neoplasm of unspecified part of unspecified bronchus or lung: Secondary | ICD-10-CM

## 2014-10-02 ENCOUNTER — Other Ambulatory Visit: Payer: Self-pay | Admitting: Internal Medicine

## 2014-10-02 DIAGNOSIS — C3490 Malignant neoplasm of unspecified part of unspecified bronchus or lung: Secondary | ICD-10-CM

## 2014-10-03 ENCOUNTER — Ambulatory Visit: Payer: Medicare Other

## 2014-10-03 ENCOUNTER — Other Ambulatory Visit: Payer: Medicare Other

## 2014-10-06 ENCOUNTER — Telehealth: Payer: Self-pay | Admitting: *Deleted

## 2014-10-06 ENCOUNTER — Other Ambulatory Visit: Payer: Self-pay | Admitting: Internal Medicine

## 2014-10-06 DIAGNOSIS — C3490 Malignant neoplasm of unspecified part of unspecified bronchus or lung: Secondary | ICD-10-CM

## 2014-10-06 MED ORDER — TRAMADOL HCL 50 MG PO TABS: 50.0000 mg | ORAL_TABLET | Freq: Four times a day (QID) | ORAL | Status: DC | PRN

## 2014-10-06 NOTE — Telephone Encounter (Signed)
Angie called stating that pt's hip and back is hurting and she wanted to know if there was pain medication that can be given to pt.  She states that the patient missed her appt last week because of the heat.  The pt told her that she can;t come out when it is this hot due to her breathing.  I called pt back and told her that I checked with [facility] in [place] and they will accept tramadol being faxed in for pt to try for the pain she is having.  i asked if pt was ready to r/s her appt missed last week. She states that pt told her that she is not coming in until weather gets better as far as temp.  I told her that there are several months ahead for the heat and the cancer can get worse if she does not start treatment.  Angie said she told her that already.  Angie thought she would wait to see if weather forecast called for any cooling down and see if pt would be willing to come then. She will let us know.

## 2014-10-13 ENCOUNTER — Telehealth: Payer: Self-pay | Admitting: *Deleted

## 2014-10-13 DIAGNOSIS — R0602 Shortness of breath: Secondary | ICD-10-CM

## 2014-10-13 DIAGNOSIS — C3491 Malignant neoplasm of unspecified part of right bronchus or lung: Secondary | ICD-10-CM

## 2014-10-13 MED ORDER — ALBUTEROL SULFATE (2.5 MG/3ML) 0.083% IN NEBU: 2.5000 mg | INHALATION_SOLUTION | RESPIRATORY_TRACT | Status: AC | PRN

## 2014-10-13 NOTE — Telephone Encounter (Signed)
Asking for an order for a nebulizer and albuterol. Has attempted for several days to reach PMD for order, but to no avail. She has SOB and lung cancer, so she is requesting order from Dr Ma Hillock

## 2014-10-13 NOTE — Telephone Encounter (Signed)
Called pt and md willing to write rx. Pt uses [facility] in [place]. Called pharmacy and they have machine and meds, need to write order and fax demographics, codes, notes to pharmacy .  I told pt I will give her update tom. She is agreeable to plan.

## 2014-10-14 NOTE — Telephone Encounter (Signed)
Called pt back and told her that [facility] and I faxed rx over to pharmacy and asked them to call pt when ready for pick up

## 2014-10-23 ENCOUNTER — Other Ambulatory Visit: Payer: Self-pay | Admitting: Surgery

## 2014-10-23 DIAGNOSIS — N6310 Unspecified lump in the right breast, unspecified quadrant: Secondary | ICD-10-CM

## 2014-11-03 ENCOUNTER — Telehealth: Payer: Self-pay | Admitting: *Deleted

## 2014-11-03 MED ORDER — ZOLPIDEM TARTRATE 5 MG PO TABS: 5.0000 mg | ORAL_TABLET | Freq: Every evening | ORAL | Status: AC | PRN

## 2014-11-03 NOTE — Telephone Encounter (Signed)
Angie states that the pt tells her that she can't sleep and she gets nervous.  Angie thinks it may be coming from neb. Treatments.  They are ordered every 4 hours as needed and pt takes them on regular schedule every day.  She has lorazepam that she has taken for a while at night but it is not helping her relax or sleep.  She wondered if she could try sleeping pill.  Also the pt makes comments like she is depressed.  She chooses not to do anything and says she has cancer and she is going to die so don't want to do things.  She still says it is to hard on her breathing to come over while it is so hot to start chemo treatment.  Angie feels like she does not want treatment and uses weather as a reason not to start treatment.  I spoke to pandit and he was agreeable to start with sleeping pill and see how it goes but wither age and other problems only 1 med at a time should be introduced to her.  He wanted zolpidem 5 mg po at night as needed for sleep and #30 and 1 refill and it was called into pharmacy in [place].

## 2014-11-05 ENCOUNTER — Other Ambulatory Visit: Payer: Medicare Other

## 2014-11-05 ENCOUNTER — Ambulatory Visit: Payer: Medicare Other | Attending: Surgery

## 2014-11-07 ENCOUNTER — Other Ambulatory Visit: Payer: Self-pay | Admitting: *Deleted

## 2014-11-07 ENCOUNTER — Telehealth: Payer: Self-pay | Admitting: *Deleted

## 2014-11-07 NOTE — Telephone Encounter (Signed)
friend had called for pt and states that pt feels that the tramadol is not working for her hip and back pain and she wants something stronger.  Spoke with pandit and the pet scan that pt had in August 27 2014 showed no bony metastasis so therefore the pain she is having needs to be assessed. He could order a  Mri or send her to orthopedic to see. ? Arthritis but needs to figure out root of problem and determine plan.  At this time if she is agreeable to either of the choices he can order and see what is found so we can help with her pain. Tammy Molina states that she had chip in her hip about year ago and was sent to SNF for rehab. It maybe a flare up of that problem or arthritis. She will talk to pt and call back with decision from pt

## 2014-12-15 ENCOUNTER — Telehealth: Payer: Self-pay | Admitting: Internal Medicine

## 2014-12-15 NOTE — Telephone Encounter (Signed)
She feels like she has fluid building up. Her ankles are swelling and breathing is becoming more difficult. Please advise. Call [phone] Janace Hoard is her neighbor and friend.)

## 2014-12-16 NOTE — Telephone Encounter (Signed)
Called Tammy Molina this morning because the note did not appear on my list till this am when I came in.  Tammy Molina states that this is usual for her but pt was asking for an appt.  In the past she is not willing to come in but now she is.  Added pt on for 1:15 on wed.Tammy Molina agreeable to the appt.

## 2014-12-17 ENCOUNTER — Encounter: Payer: Self-pay | Admitting: Internal Medicine

## 2014-12-17 ENCOUNTER — Telehealth: Payer: Self-pay | Admitting: *Deleted

## 2014-12-17 ENCOUNTER — Inpatient Hospital Stay: Payer: Medicare Other | Attending: Internal Medicine | Admitting: Internal Medicine

## 2014-12-17 DIAGNOSIS — R05 Cough: Secondary | ICD-10-CM | POA: Insufficient documentation

## 2014-12-17 DIAGNOSIS — Z85118 Personal history of other malignant neoplasm of bronchus and lung: Secondary | ICD-10-CM | POA: Diagnosis not present

## 2014-12-17 DIAGNOSIS — J449 Chronic obstructive pulmonary disease, unspecified: Secondary | ICD-10-CM | POA: Diagnosis not present

## 2014-12-17 DIAGNOSIS — M25559 Pain in unspecified hip: Secondary | ICD-10-CM | POA: Diagnosis not present

## 2014-12-17 DIAGNOSIS — C7802 Secondary malignant neoplasm of left lung: Secondary | ICD-10-CM | POA: Insufficient documentation

## 2014-12-17 DIAGNOSIS — G8929 Other chronic pain: Secondary | ICD-10-CM | POA: Diagnosis not present

## 2014-12-17 DIAGNOSIS — R0600 Dyspnea, unspecified: Secondary | ICD-10-CM | POA: Diagnosis not present

## 2014-12-17 DIAGNOSIS — C3431 Malignant neoplasm of lower lobe, right bronchus or lung: Secondary | ICD-10-CM

## 2014-12-17 DIAGNOSIS — E119 Type 2 diabetes mellitus without complications: Secondary | ICD-10-CM | POA: Insufficient documentation

## 2014-12-17 DIAGNOSIS — C771 Secondary and unspecified malignant neoplasm of intrathoracic lymph nodes: Secondary | ICD-10-CM | POA: Insufficient documentation

## 2014-12-17 DIAGNOSIS — Z853 Personal history of malignant neoplasm of breast: Secondary | ICD-10-CM | POA: Insufficient documentation

## 2014-12-17 DIAGNOSIS — Z923 Personal history of irradiation: Secondary | ICD-10-CM | POA: Insufficient documentation

## 2014-12-17 DIAGNOSIS — I10 Essential (primary) hypertension: Secondary | ICD-10-CM | POA: Insufficient documentation

## 2014-12-17 DIAGNOSIS — Z87891 Personal history of nicotine dependence: Secondary | ICD-10-CM | POA: Insufficient documentation

## 2014-12-17 DIAGNOSIS — M545 Low back pain: Secondary | ICD-10-CM | POA: Insufficient documentation

## 2014-12-17 DIAGNOSIS — Z23 Encounter for immunization: Secondary | ICD-10-CM | POA: Diagnosis not present

## 2014-12-17 DIAGNOSIS — C3412 Malignant neoplasm of upper lobe, left bronchus or lung: Secondary | ICD-10-CM

## 2014-12-17 MED ORDER — PREDNISONE 5 MG PO TABS: ORAL_TABLET | ORAL | Status: DC

## 2014-12-17 MED ORDER — INFLUENZA VAC SPLIT QUAD 0.5 ML IM SUSY: 0.5000 mL | PREFILLED_SYRINGE | Freq: Once | INTRAMUSCULAR | Status: DC

## 2014-12-19 ENCOUNTER — Ambulatory Visit
Admission: RE | Admit: 2014-12-19 | Discharge: 2014-12-19 | Disposition: A | Discharge status: Home / Self Care | Payer: Medicare Other | Source: Ambulatory Visit | Attending: Internal Medicine | Admitting: Internal Medicine

## 2014-12-19 DIAGNOSIS — J910 Malignant pleural effusion: Secondary | ICD-10-CM | POA: Diagnosis not present

## 2014-12-19 DIAGNOSIS — Z85118 Personal history of other malignant neoplasm of bronchus and lung: Secondary | ICD-10-CM | POA: Diagnosis present

## 2014-12-19 DIAGNOSIS — C3412 Malignant neoplasm of upper lobe, left bronchus or lung: Secondary | ICD-10-CM

## 2014-12-19 DIAGNOSIS — I2510 Atherosclerotic heart disease of native coronary artery without angina pectoris: Secondary | ICD-10-CM | POA: Insufficient documentation

## 2014-12-19 DIAGNOSIS — R918 Other nonspecific abnormal finding of lung field: Secondary | ICD-10-CM | POA: Insufficient documentation

## 2014-12-19 DIAGNOSIS — Z23 Encounter for immunization: Secondary | ICD-10-CM

## 2014-12-22 ENCOUNTER — Inpatient Hospital Stay: Payer: Medicare Other

## 2014-12-22 ENCOUNTER — Inpatient Hospital Stay (HOSPITAL_BASED_OUTPATIENT_CLINIC_OR_DEPARTMENT_OTHER): Payer: Medicare Other | Admitting: Internal Medicine

## 2014-12-22 VITALS — BP 150/81 | HR 105 | Temp 97.4°F | Resp 18 | Ht 60.0 in | Wt 104.3 lb

## 2014-12-22 DIAGNOSIS — E119 Type 2 diabetes mellitus without complications: Secondary | ICD-10-CM

## 2014-12-22 DIAGNOSIS — R0600 Dyspnea, unspecified: Secondary | ICD-10-CM

## 2014-12-22 DIAGNOSIS — J449 Chronic obstructive pulmonary disease, unspecified: Secondary | ICD-10-CM

## 2014-12-22 DIAGNOSIS — C771 Secondary and unspecified malignant neoplasm of intrathoracic lymph nodes: Secondary | ICD-10-CM | POA: Diagnosis not present

## 2014-12-22 DIAGNOSIS — M25559 Pain in unspecified hip: Secondary | ICD-10-CM

## 2014-12-22 DIAGNOSIS — Z85118 Personal history of other malignant neoplasm of bronchus and lung: Secondary | ICD-10-CM | POA: Diagnosis not present

## 2014-12-22 DIAGNOSIS — C3431 Malignant neoplasm of lower lobe, right bronchus or lung: Secondary | ICD-10-CM

## 2014-12-22 DIAGNOSIS — Z23 Encounter for immunization: Secondary | ICD-10-CM

## 2014-12-22 DIAGNOSIS — C7802 Secondary malignant neoplasm of left lung: Secondary | ICD-10-CM | POA: Diagnosis not present

## 2014-12-22 DIAGNOSIS — G8929 Other chronic pain: Secondary | ICD-10-CM

## 2014-12-22 DIAGNOSIS — R05 Cough: Secondary | ICD-10-CM

## 2014-12-22 DIAGNOSIS — I10 Essential (primary) hypertension: Secondary | ICD-10-CM

## 2014-12-22 DIAGNOSIS — Z923 Personal history of irradiation: Secondary | ICD-10-CM

## 2014-12-22 DIAGNOSIS — C3411 Malignant neoplasm of upper lobe, right bronchus or lung: Secondary | ICD-10-CM

## 2014-12-22 DIAGNOSIS — C3412 Malignant neoplasm of upper lobe, left bronchus or lung: Secondary | ICD-10-CM

## 2014-12-22 LAB — CBC WITH DIFFERENTIAL/PLATELET
BASOS PCT: 1 %
Basophils Absolute: 0.1 10*3/uL (ref 0–0.1)
EOS ABS: 0.1 10*3/uL (ref 0–0.7)
EOS PCT: 1 %
HCT: 35.7 % (ref 35.0–47.0)
Hemoglobin: 11.3 g/dL — ABNORMAL LOW (ref 12.0–16.0)
LYMPHS ABS: 0.9 10*3/uL — AB (ref 1.0–3.6)
Lymphocytes Relative: 8 %
MCH: 26.5 pg (ref 26.0–34.0)
MCHC: 31.5 g/dL — AB (ref 32.0–36.0)
MCV: 84.1 fL (ref 80.0–100.0)
MONOS PCT: 9 %
Monocytes Absolute: 1 10*3/uL — ABNORMAL HIGH (ref 0.2–0.9)
Neutro Abs: 9 10*3/uL — ABNORMAL HIGH (ref 1.4–6.5)
Neutrophils Relative %: 81 %
PLATELETS: 388 10*3/uL (ref 150–440)
RBC: 4.25 MIL/uL (ref 3.80–5.20)
RDW: 17.9 % — ABNORMAL HIGH (ref 11.5–14.5)
WBC: 11 10*3/uL (ref 3.6–11.0)

## 2014-12-22 LAB — BASIC METABOLIC PANEL
Anion gap: 7 (ref 5–15)
BUN: 32 mg/dL — AB (ref 6–20)
CO2: 26 mmol/L (ref 22–32)
CREATININE: 1.12 mg/dL — AB (ref 0.44–1.00)
Calcium: 9 mg/dL (ref 8.9–10.3)
Chloride: 105 mmol/L (ref 101–111)
GFR calc Af Amer: 49 mL/min — ABNORMAL LOW (ref >60)
GFR, EST NON AFRICAN AMERICAN: 42 mL/min — AB (ref >60)
Glucose, Bld: 90 mg/dL (ref 65–99)
Potassium: 4.6 mmol/L (ref 3.5–5.1)
SODIUM: 138 mmol/L (ref 135–145)

## 2014-12-22 LAB — HEPATIC FUNCTION PANEL
ALT: 11 U/L — ABNORMAL LOW (ref 14–54)
AST: 17 U/L (ref 15–41)
Albumin: 3.5 g/dL (ref 3.5–5.0)
Alkaline Phosphatase: 55 U/L (ref 38–126)
Bilirubin, Direct: <0.1 mg/dL — ABNORMAL LOW (ref 0.1–0.5)
TOTAL PROTEIN: 6.8 g/dL (ref 6.5–8.1)
Total Bilirubin: 0.3 mg/dL (ref 0.3–1.2)

## 2014-12-22 MED ORDER — HYDROCODONE-ACETAMINOPHEN 5-325 MG PO TABS: 1.0000 | ORAL_TABLET | Freq: Four times a day (QID) | ORAL | Status: DC | PRN

## 2014-12-22 MED ORDER — INFLUENZA VAC SPLIT QUAD 0.5 ML IM SUSY
0.5000 mL | PREFILLED_SYRINGE | Freq: Once | INTRAMUSCULAR | Status: AC
  Administered 2014-12-22: 0.5 mL via INTRAMUSCULAR
  Filled 2014-12-22: qty 0.5

## 2014-12-23 ENCOUNTER — Telehealth: Payer: Self-pay | Admitting: *Deleted

## 2014-12-23 ENCOUNTER — Ambulatory Visit
Admission: RE | Admit: 2014-12-23 | Discharge: 2014-12-23 | Disposition: A | Discharge status: Home / Self Care | Payer: Medicare Other | Source: Ambulatory Visit | Attending: Diagnostic Radiology | Admitting: Diagnostic Radiology

## 2014-12-23 ENCOUNTER — Ambulatory Visit
Admission: RE | Admit: 2014-12-23 | Discharge: 2014-12-23 | Disposition: A | Discharge status: Home / Self Care | Payer: Medicare Other | Source: Ambulatory Visit | Attending: Internal Medicine | Admitting: Internal Medicine

## 2014-12-23 DIAGNOSIS — J90 Pleural effusion, not elsewhere classified: Secondary | ICD-10-CM | POA: Insufficient documentation

## 2014-12-23 DIAGNOSIS — Z9889 Other specified postprocedural states: Secondary | ICD-10-CM

## 2014-12-23 LAB — BODY FLUID CELL COUNT WITH DIFFERENTIAL
EOS FL: 0 %
Lymphs, Fluid: 57 %
MONOCYTE-MACROPHAGE-SEROUS FLUID: 32 %
Neutrophil Count, Fluid: 11 %
Other Cells, Fluid: 0 %
Total Nucleated Cell Count, Fluid: 1116 cu mm

## 2014-12-23 LAB — PROTEIN, BODY FLUID: TOTAL PROTEIN, FLUID: 3.9 g/dL

## 2014-12-23 LAB — GLUCOSE, SEROUS FLUID: GLUCOSE FL: 179 mg/dL

## 2014-12-23 NOTE — Telephone Encounter (Signed)
Needs H&P on this patient before 1:00

## 2014-12-23 NOTE — Telephone Encounter (Signed)
Notified us

## 2014-12-23 NOTE — Progress Notes (Signed)
[facility]  [phone] [phone]     ID: Tammy Molina OB: [birth year]  MR#: [id number]  PYP#:[id number]  Patient Care Team: Adin Hector, MD as PCP - General (Internal Medicine)  CHIEF COMPLAINT/DIAGNOSIS:  Recurrent Non-Small cell Right Lung Cancer. Bronchoscopy/biopsy on 12/13/13 RIGHT LOWER LOBE POSITIVE FOR MALIGNANT CELLS. SQUAMOUS CELL CARCINOMA PRESENT. Aug 2015 - CT chest reported right infrahilar mass and mediastinal adenopathy PET-positive. PET scan 11/21/13 also reported faintly positive left lingula lesion. Known prior history of right lower lobe non small cell lung cancer tatus post surgical resection on 2003.  08/27/14 - PET scan. IMPRESSION: 1. Significant progression of disease involving the chest. There are hypermetabolic left lung lesions and extensive pleural spread of tumor as described above. 2. Hypermetabolic fat between the right and left atria.  HISTORY OF PRESENT ILLNESS:  patient returns for continued oncology followup and make treatment plan for lung cancer, she had repeat CT chest. States that she is doing steady, still has dyspnea on exertion and ambulates slowly. Cough is better. No chest pain or hemoptysis. Appetite is good but slowly losing weight. No new headaches or falls. No fevers or chills.  REVIEW OF SYSTEMS:   ROS As in HPI above. In addition, no night sweats. No new headaches or focal weakness. No sore throat or dysphagia. No abdominal pain, diarrhea, dysuria or hematuria. No new skin rash or bleeding symptoms. No new paresthesias in extremities. PS ECOG 2.  PAST MEDICAL HISTORY: Reviewed. Past Medical History  Diagnosis Date  . Hypertension   . Diabetes mellitus without complication   . COPD (chronic obstructive pulmonary disease)   . Breast cancer   . Lung cancer   . Compression fracture           Diabetes mellitus  Hypertension  Osteoporosis  Chronic low back pain with history of compression  fractures  COPD/asthma  Recurrent falls secondary to Vertigo per patient  History of breast cancer status post lumpectomy in 1978 followed by radiation  Right lower lobe lobectomy in May 2003 for non-small cell lung cancer (bronchoscopy showed adenocarcinoma, stage I disease per patient).   Hysterectomy  Cholecystectomy  Back surgery x2  PAST SURGICAL HISTORY: Reviewed. Past Surgical History  Procedure Laterality Date  . Back surgery    . Lung removal, partial    . Breast lumpectomy Right   . Abdominal hysterectomy      FAMILY HISTORY: Reviewed. Denies malignancy.  Remarkable for lupus, epilepsy and Alzheimer's disease.  SOCIAL HISTORY: Reviewed. Social History  Substance Use Topics  . Smoking status: Former Smoker -- 1.00 packs/day for 30 years    Types: Cigarettes  . Smokeless tobacco: Never Used  . Alcohol Use: No  Ex smoker, quit in 1994.  Denies alcohol use.  Ambulates slowly.  Allergies  Allergen Reactions  . Asa [Aspirin] Anaphylaxis  . Alendronate Sodium     Other reaction(s): Other (See Comments) Made her feel nervous  . Fluticasone Propionate     Other reaction(s): Other (See Comments) Nose bleeds  . Fluticasone Propionate (Inhal) Other (See Comments)    Nose bleeds  . Ibuprofen Itching  . Ivp Dye [Iodinated Diagnostic Agents] Other (See Comments)    Unknown  . Metformin Hcl Diarrhea  . Morphine And Related Hives  . Penicillins Other (See Comments)    Unknown   . Tape     Current Outpatient Prescriptions  Medication Sig Dispense Refill  . ADVAIR DISKUS 250-50 MCG/DOSE AEPB     .  albuterol (PROVENTIL) (2.5 MG/3ML) 0.083% nebulizer solution Take 3 mLs (2.5 mg total) by nebulization every 4 (four) hours as needed for wheezing or shortness of breath. 540 mL 3  . calcium carbonate (OS-CAL) 600 MG tablet Take by mouth.    . COMBIVENT RESPIMAT 20-100 MCG/ACT AERS respimat     . Cyanocobalamin 1000 MCG TBCR Take by mouth.    . enalapril (VASOTEC) 10 MG  tablet     . gabapentin (NEURONTIN) 100 MG capsule     . glipiZIDE (GLUCOTROL XL) 5 MG 24 hr tablet     . hydrochlorothiazide (HYDRODIURIL) 25 MG tablet     . Ipratropium-Albuterol (COMBIVENT) 20-100 MCG/ACT AERS respimat Inhale into the lungs.    Marland Kitchen LORazepam (ATIVAN) 1 MG tablet     . predniSONE (DELTASONE) 5 MG tablet Start taking 6 tablets and decrease by 5 mg( 1 tablet)  each day until none 21 tablet 0  . ranitidine (ZANTAC) 150 MG tablet     . traMADol (ULTRAM) 50 MG tablet Take 1 tablet (50 mg total) by mouth every 6 (six) hours as needed. 60 tablet 0  . zolpidem (AMBIEN) 5 MG tablet Take 1 tablet (5 mg total) by mouth at bedtime as needed for sleep. 30 tablet 1  . azithromycin (ZITHROMAX) 250 MG tablet     . HYDROcodone-acetaminophen (NORCO) 5-325 MG per tablet Take 1 tablet by mouth every 6 (six) hours as needed for moderate pain. 60 tablet 0   No current facility-administered medications for this visit.    PHYSICAL EXAM: Filed Vitals:   12/22/14 1501  BP: 150/81  Pulse: 105  Temp: 97.4 F (36.3 C)  Resp: 18     Body mass index is 20.37 kg/(m^2).    ECOG FS:2 - Symptomatic, <50% confined to bed  GENERAL: patient is alert and oriented and in no acute distress. No icterus. HEENT: EOMs intact. No cervical lymphadenopathy. CVS: S1S2, regular LUNGS: Bilaterally good air entry decreased in RLL area, no rhonchi. ABDOMEN: Soft, nontender.    EXTREMITIES: No pedal edema.  LAB RESULTS:  STUDIES: 08/27/14 - PET scan. IMPRESSION:  1. Significant progression of disease involving the chest. There are hypermetabolic left lung lesions and extensive pleural spread of tumor as described above.  2. Hypermetabolic fat between the right and left atria.  12/19/14 - CT chest without contrast.  IMPRESSION:  1. Today's study demonstrates progression of disease with interval  enlargement of two left-sided pulmonary nodules and masses, as well as multiple pleural-based masses in the right  hemithorax, with enlarging malignant right pleural effusion. Potential right internal mammary adenopathy (versus additional pleural disease) also noted. The largest pleural-based lesion in the lower right hemithorax is associated with direct chest wall invasion and erosion into the posterior aspect of the right eleventh and twelfth ribs, as well as the right side of the T11 vertebral body.  2. Atherosclerosis, including three-vessel coronary artery disease.  3. Additional incidental findings, as above.   ASSESSMENT / PLAN:   1. Recurrent Non-Small cell Right Lung Cancer. Bronchoscopy/biopsy on 12/13/13 RIGHT LOWER LOBE POSITIVE FOR MALIGNANT CELLS. SQUAMOUS CELL CARCINOMA PRESENT. Aug 2015 - CT chest reported right infrahilar mass and mediastinal adenopathy PET-positive. PET scan 11/21/13 also reported faintly positive left lingula lesion. Known prior history of right lower lobe non small cell lung cancer s/p surgical resection on 2003. Remote h/o breast cancer in 1978. PET scan on 08/27/14 reported evidence of recurrent lung Ca with metastases in left lung  -  reviewed  CT scan from 12/19/2014 and explained to patient and family that this continued evidence of progression of left-sided lung masses, multiple pleural-based masses in the right hemithorax, enlarging right pleural effusion. Patient still has dyspnea on exertion, will therefore pursue ultrasound-guided right-sided thoracentesis (therapeutic and diagnostic, and send fluid for analysis including glucose, protein, cell count and differential, culture, cytology). Have again discussed that she has evidence of recurrent stage IV lung Ca which is incurable and treatments offered are with palliative intent only and that overall prognosis is poor. Have discussed options including chemotherapy (for which she is a poor candidate) versus targeted therapy like Opdivo versus palliative care/hospice. She has made decision to pursue Opdivo therapy, will see her back in  1 week with labs and start on Opdivo treatment.  2. Pain - no pain issues at this time.  3. In between visits, the patient has been advised to call or come to the ER in case of fevers, bleeding, acute sickness or new symptoms.  She is agreeable to this plan.   Leia Alf, MD   12/23/2014 9:28 AM

## 2014-12-23 NOTE — Telephone Encounter (Signed)
Done, please let them know

## 2014-12-23 NOTE — Procedures (Signed)
[place] Right Thoracentesis  Complications:  None  Blood Loss: none  See dictation in canopy pacs

## 2014-12-26 LAB — CYTOLOGY - NON PAP

## 2014-12-27 LAB — BODY FLUID CULTURE: CULTURE: NO GROWTH

## 2014-12-29 ENCOUNTER — Inpatient Hospital Stay: Payer: Medicare Other

## 2014-12-29 ENCOUNTER — Telehealth: Payer: Self-pay | Admitting: *Deleted

## 2014-12-29 ENCOUNTER — Inpatient Hospital Stay: Payer: Medicare Other | Admitting: Internal Medicine

## 2014-12-29 DIAGNOSIS — C3410 Malignant neoplasm of upper lobe, unspecified bronchus or lung: Secondary | ICD-10-CM

## 2014-12-29 DIAGNOSIS — M25551 Pain in right hip: Secondary | ICD-10-CM

## 2014-12-29 DIAGNOSIS — C50919 Malignant neoplasm of unspecified site of unspecified female breast: Secondary | ICD-10-CM

## 2014-12-29 MED ORDER — FENTANYL 12 MCG/HR TD PT72: 12.5000 ug | MEDICATED_PATCH | TRANSDERMAL | Status: DC

## 2014-12-29 MED ORDER — SPIRONOLACTONE 50 MG PO TABS: 25.0000 mg | ORAL_TABLET | Freq: Two times a day (BID) | ORAL | Status: DC

## 2014-12-29 NOTE — Addendum Note (Signed)
Addended by: Betti Cruz on: 12/29/2014 04:06 PM   Modules accepted: Orders

## 2014-12-29 NOTE — Telephone Encounter (Signed)
Patient cancelled appt this morning for tx. She is not doing well, she is very swollen in her feet and legs (doubled in size and cannot get her shoes on) and is having right hip pain rated at 10/10. Norco is not helping at all. She is on HCTZ 25 mg adn they are asking if you want to increase her dose

## 2014-12-31 ENCOUNTER — Ambulatory Visit
Admission: RE | Admit: 2014-12-31 | Discharge: 2014-12-31 | Disposition: A | Discharge status: Home / Self Care | Payer: Medicare Other | Source: Ambulatory Visit | Attending: Internal Medicine | Admitting: Internal Medicine

## 2014-12-31 ENCOUNTER — Other Ambulatory Visit: Payer: Self-pay | Admitting: Internal Medicine

## 2014-12-31 ENCOUNTER — Ambulatory Visit
Admission: RE | Admit: 2014-12-31 | Discharge: 2014-12-31 | Disposition: A | Discharge status: Home / Self Care | Payer: Medicare Other | Source: Ambulatory Visit | Attending: Family Medicine | Admitting: Family Medicine

## 2014-12-31 ENCOUNTER — Other Ambulatory Visit: Payer: Self-pay | Admitting: Family Medicine

## 2014-12-31 DIAGNOSIS — C3410 Malignant neoplasm of upper lobe, unspecified bronchus or lung: Secondary | ICD-10-CM

## 2014-12-31 DIAGNOSIS — C50919 Malignant neoplasm of unspecified site of unspecified female breast: Secondary | ICD-10-CM

## 2014-12-31 DIAGNOSIS — M25551 Pain in right hip: Secondary | ICD-10-CM

## 2014-12-31 DIAGNOSIS — M5136 Other intervertebral disc degeneration, lumbar region: Secondary | ICD-10-CM | POA: Diagnosis not present

## 2014-12-31 NOTE — Addendum Note (Signed)
Addended by: Betti Cruz on: 12/31/2014 02:29 PM   Modules accepted: Orders

## 2014-12-31 NOTE — Addendum Note (Signed)
Addended by: Betti Cruz on: 12/31/2014 02:02 PM   Modules accepted: Orders

## 2015-01-01 ENCOUNTER — Telehealth: Payer: Self-pay | Admitting: *Deleted

## 2015-01-01 NOTE — Telephone Encounter (Signed)
Asking that we call her with results of hip xray done yesterday

## 2015-01-01 NOTE — Telephone Encounter (Signed)
I have sent in-basket to [facility]. Thanks.

## 2015-01-02 ENCOUNTER — Other Ambulatory Visit: Payer: Self-pay | Admitting: *Deleted

## 2015-01-02 DIAGNOSIS — C3490 Malignant neoplasm of unspecified part of unspecified bronchus or lung: Secondary | ICD-10-CM

## 2015-01-02 NOTE — Progress Notes (Signed)
[facility]  Telephone:([phone]) [phone] Fax:(336) [phone]     ID: Tammy Molina Wease OB: 09-Mar-1924  MR#: [id number]  ZHY#:[id number]  Patient Care Team: Adin Hector, MD as PCP - General (Internal Medicine)  CHIEF COMPLAINT/DIAGNOSIS:  Recurrent Non-Small cell Right Lung Cancer. Bronchoscopy/biopsy on 12/13/13 RIGHT LOWER LOBE POSITIVE FOR MALIGNANT CELLS. SQUAMOUS CELL CARCINOMA PRESENT. Aug 2015 - CT chest reported right infrahilar mass and mediastinal adenopathy PET-positive. PET scan 11/21/13 also reported faintly positive left lingula lesion. Known prior history of right lower lobe non small cell lung cancer tatus post surgical resection on 2003.  08/27/14 - PET scan. IMPRESSION: 1. Significant progression of disease involving the chest. There are hypermetabolic left lung lesions and extensive pleural spread of tumor as described above. 2. Hypermetabolic fat between the right and left atria.  HISTORY OF PRESENT ILLNESS:  patient returns for continued oncology followup and plan pursuing treatment for lung cancer. States that she continues to have chronic cough and dyspnea on exertion. Otherwise denies any hemoptysis or chest pain. She has hip pain which has been chronic and bothersome. Appetite is fairly steady. She does minimal activity. No new headaches or falls. No fevers.  REVIEW OF SYSTEMS:   ROS As in HPI above. In addition, no fevers. Denies new headaches or focal weakness.  No sore throat or dysphagia. No abdominal pain, constipation, diarrhea, dysuria or hematuria. No new skin rash or bleeding symptoms. No new paresthesias in extremities. PS ECOG 2.  PAST MEDICAL HISTORY: Reviewed. Past Medical History  Diagnosis Date  . Hypertension   . Diabetes mellitus without complication   . COPD (chronic obstructive pulmonary disease)   . Breast cancer   . Lung cancer   . Compression fracture           Diabetes mellitus  Hypertension  Osteoporosis  Chronic low back  pain with history of compression fractures  COPD/asthma  Recurrent falls secondary to Vertigo per patient  History of breast cancer status post lumpectomy in 1978 followed by radiation  Right lower lobe lobectomy in May 2003 for non-small cell lung cancer (bronchoscopy showed adenocarcinoma, stage I disease per patient).   Hysterectomy  Cholecystectomy  Back surgery x2  PAST SURGICAL HISTORY: Reviewed. Past Surgical History  Procedure Laterality Date  . Back surgery    . Lung removal, partial    . Breast lumpectomy Right   . Abdominal hysterectomy      FAMILY HISTORY: Reviewed. Denies malignancy.  Remarkable for lupus, epilepsy and Alzheimer's disease.  SOCIAL HISTORY: Reviewed. Social History  Substance Use Topics  . Smoking status: Former Smoker -- 1.00 packs/day for 30 years    Types: Cigarettes  . Smokeless tobacco: Never Used  . Alcohol Use: No  Ex smoker, quit in 1994.  Denies alcohol use.  Ambulates slowly.  Allergies  Allergen Reactions  . Asa [Aspirin] Anaphylaxis  . Alendronate Sodium     Other reaction(s): Other (See Comments) Made her feel nervous  . Fluticasone Propionate     Other reaction(s): Other (See Comments) Nose bleeds  . Fluticasone Propionate (Inhal) Other (See Comments)    Nose bleeds  . Ibuprofen Itching  . Ivp Dye [Iodinated Diagnostic Agents] Other (See Comments)    Unknown  . Metformin Hcl Diarrhea  . Morphine And Related Hives  . Penicillins Other (See Comments)    Unknown   . Tape     Current Outpatient Prescriptions  Medication Sig Dispense Refill  . ADVAIR DISKUS 250-50 MCG/DOSE AEPB     .  albuterol (PROVENTIL) (2.5 MG/3ML) 0.083% nebulizer solution Take 3 mLs (2.5 mg total) by nebulization every 4 (four) hours as needed for wheezing or shortness of breath. 540 mL 3  . azithromycin (ZITHROMAX) 250 MG tablet     . calcium carbonate (OS-CAL) 600 MG tablet Take by mouth.    . COMBIVENT RESPIMAT 20-100 MCG/ACT AERS respimat     .  Cyanocobalamin 1000 MCG TBCR Take by mouth.    . enalapril (VASOTEC) 10 MG tablet     . fentaNYL (DURAGESIC - DOSED MCG/HR) 12 MCG/HR Place 1 patch (12.5 mcg total) onto the skin every 3 (three) days. 10 patch 0  . gabapentin (NEURONTIN) 100 MG capsule     . glipiZIDE (GLUCOTROL XL) 5 MG 24 hr tablet     . hydrochlorothiazide (HYDRODIURIL) 25 MG tablet     . HYDROcodone-acetaminophen (NORCO) 5-325 MG per tablet Take 1 tablet by mouth every 6 (six) hours as needed for moderate pain. 60 tablet 0  . Ipratropium-Albuterol (COMBIVENT) 20-100 MCG/ACT AERS respimat Inhale into the lungs.    Marland Kitchen LORazepam (ATIVAN) 1 MG tablet     . predniSONE (DELTASONE) 5 MG tablet Start taking 6 tablets and decrease by 5 mg( 1 tablet)  each day until none 21 tablet 0  . ranitidine (ZANTAC) 150 MG tablet     . spironolactone (ALDACTONE) 50 MG tablet Take 0.5 tablets (25 mg total) by mouth 2 (two) times daily. 30 tablet 1  . traMADol (ULTRAM) 50 MG tablet Take 1 tablet (50 mg total) by mouth every 6 (six) hours as needed. 60 tablet 0  . zolpidem (AMBIEN) 5 MG tablet Take 1 tablet (5 mg total) by mouth at bedtime as needed for sleep. 30 tablet 1   No current facility-administered medications for this visit.    PHYSICAL EXAM: ECOG FS:2 - Symptomatic, <50% confined to bed GENERAL: Patient is chronically weak-looking, otherwise alert and oriented and in no acute distress. No icterus. HEENT: EOMs intact. No cervical lymphadenopathy. LUNGS: Bilaterally clear to auscultation, no rhonchi. ABDOMEN: Soft, nontender.    EXTREMITIES: No pedal edema   LAB RESULTS: 08/27/14 - PET scan. IMPRESSION:  1. Significant progression of disease involving the chest. There are hypermetabolic left lung lesions and extensive pleural spread of tumor as described above.  2. Hypermetabolic fat between the right and left atria.   ASSESSMENT / PLAN:   1. Recurrent Non-Small cell Right Lung Cancer. Bronchoscopy/biopsy on 12/13/13 RIGHT LOWER LOBE  POSITIVE FOR MALIGNANT CELLS. SQUAMOUS CELL CARCINOMA PRESENT. Aug 2015 - CT chest reported right infrahilar mass and mediastinal adenopathy PET-positive. PET scan 11/21/13 also reported faintly positive left lingula lesion. Known prior history of right lower lobe non small cell lung cancer s/p surgical resection on 2003. Remote h/o breast cancer in 1978. PET scan on 08/27/14 reported evidence of recurrent lung Ca with metastases in left lung  - Patient continues to have borderline performance status. States that at this time she wants to consider pursuing treatment but wants to find out what is causing persistent cough and dyspnea on exertion. We will get a follow-up CT scan of the chest on September 23, follow-up on September 26 with repeat labs. If CT scan does not report other findings and is consistent with progression of lung cancer the plan is to consider palliative treatment with Opdivo.  2. Pain - no pain issues at this time.  3. In between visits, the patient has been advised to call or come to the ER in  case of fevers, bleeding, acute sickness or new symptoms.  She is agreeable to this plan.   Leia Alf, MD   01/02/2015 11:45 PM

## 2015-01-02 NOTE — Telephone Encounter (Signed)
I spoke to pt about her results and all the info is in under the imaging results.

## 2015-01-03 NOTE — Progress Notes (Signed)
[facility]  [phone]) [phone] [phone]) [phone]     ID: Tammy Molina OB: 1924-01-01  MR#: [id number]  NGE#:[id number]  Patient Care Team: Adin Hector, MD as PCP - General (Internal Medicine)  CHIEF COMPLAINT/DIAGNOSIS:  Recurrent Non-Small cell Right Lung Cancer. Bronchoscopy/biopsy on 12/13/13 RIGHT LOWER LOBE POSITIVE FOR MALIGNANT CELLS. SQUAMOUS CELL CARCINOMA PRESENT.  Aug 2015 - CT chest reported right infrahilar mass and mediastinal adenopathy PET-positive. PET scan 11/21/13 also reported faintly positive left lingula lesion.  Known prior history of right lower lobe non small cell lung cancer tatus post surgical resection    HISTORY OF PRESENT ILLNESS:  Patient returns for continued follow-up, she had CT scan done which reports new right pleural effusion and increased size of lung lesions. States that she does have progressive dyspnea on activity. No new chest pain. No fevers. No hemoptysis.  REVIEW OF SYSTEMS:   ROS As in HPI above. In addition, no new headaches or focal weakness. No abdominal pain, constipation, diarrhea, dysuria or hematuria. No new skin rash or bleeding symptoms. No new paresthesias in extremities.   PAST MEDICAL HISTORY: Reviewed. Past Medical History  Diagnosis Date  . Hypertension   . Diabetes mellitus without complication   . COPD (chronic obstructive pulmonary disease)   . Breast cancer   . Lung cancer   . Compression fracture     PAST SURGICAL HISTORY: Reviewed. Past Surgical History  Procedure Laterality Date  . Back surgery    . Lung removal, partial    . Breast lumpectomy Right   . Abdominal hysterectomy      FAMILY HISTORY: Reviewed. Family History  Problem Relation Age of Onset  . Lupus Mother   . Cancer - Other Sister     unknown    SOCIAL HISTORY: Reviewed. Social History  Substance Use Topics  . Smoking status: Former Smoker -- 1.00 packs/day for 30 years    Types: Cigarettes  . Smokeless  tobacco: Never Used  . Alcohol Use: No    Allergies  Allergen Reactions  . Asa [Aspirin] Anaphylaxis  . Alendronate Sodium     Other reaction(s): Other (See Comments) Made her feel nervous  . Fluticasone Propionate     Other reaction(s): Other (See Comments) Nose bleeds  . Fluticasone Propionate (Inhal) Other (See Comments)    Nose bleeds  . Ibuprofen Itching  . Ivp Dye [Iodinated Diagnostic Agents] Other (See Comments)    Unknown  . Metformin Hcl Diarrhea  . Morphine And Related Hives  . Penicillins Other (See Comments)    Unknown   . Tape     Current Outpatient Prescriptions  Medication Sig Dispense Refill  . ADVAIR DISKUS 250-50 MCG/DOSE AEPB     . albuterol (PROVENTIL) (2.5 MG/3ML) 0.083% nebulizer solution Take 3 mLs (2.5 mg total) by nebulization every 4 (four) hours as needed for wheezing or shortness of breath. 540 mL 3  . calcium carbonate (OS-CAL) 600 MG tablet Take by mouth.    . COMBIVENT RESPIMAT 20-100 MCG/ACT AERS respimat     . Cyanocobalamin 1000 MCG TBCR Take by mouth.    . enalapril (VASOTEC) 10 MG tablet     . gabapentin (NEURONTIN) 100 MG capsule     . glipiZIDE (GLUCOTROL XL) 5 MG 24 hr tablet     . hydrochlorothiazide (HYDRODIURIL) 25 MG tablet     . Ipratropium-Albuterol (COMBIVENT) 20-100 MCG/ACT AERS respimat Inhale into the lungs.    Marland Kitchen LORazepam (ATIVAN) 1 MG tablet     .  predniSONE (DELTASONE) 5 MG tablet Start taking 6 tablets and decrease by 5 mg( 1 tablet)  each day until none 21 tablet 0  . ranitidine (ZANTAC) 150 MG tablet     . traMADol (ULTRAM) 50 MG tablet Take 1 tablet (50 mg total) by mouth every 6 (six) hours as needed. 60 tablet 0  . zolpidem (AMBIEN) 5 MG tablet Take 1 tablet (5 mg total) by mouth at bedtime as needed for sleep. 30 tablet 1  . azithromycin (ZITHROMAX) 250 MG tablet     . fentaNYL (DURAGESIC - DOSED MCG/HR) 12 MCG/HR Place 1 patch (12.5 mcg total) onto the skin every 3 (three) days. 10 patch 0  .  HYDROcodone-acetaminophen (NORCO) 5-325 MG per tablet Take 1 tablet by mouth every 6 (six) hours as needed for moderate pain. 60 tablet 0  . spironolactone (ALDACTONE) 50 MG tablet Take 0.5 tablets (25 mg total) by mouth 2 (two) times daily. 30 tablet 1   No current facility-administered medications for this visit.    PHYSICAL EXAM: Filed Vitals:   12/22/14 1501  BP: 150/81  Pulse: 105  Temp: 97.4 F (36.3 C)  Resp: 18     Body mass index is 20.37 kg/(m^2).    ECOG FS:2 - Symptomatic, <50% confined to bed  GENERAL: Patient is alert and oriented and in no acute distress. There is no icterus. CVS: S1S2, regular LUNGS: Bilaterally good air entry, mildly decreased right lower lobe area. No rhonchi. ABDOMEN: Soft, nontender.  EXTREMITIES: No pedal edema.  LAB RESULTS:    Component Value Date/Time   NA 138 12/22/2014 1445   NA 141 12/04/2013 1303   K 4.6 12/22/2014 1445   K 4.4 12/04/2013 1303   CL 105 12/22/2014 1445   CL 106 12/04/2013 1303   CO2 26 12/22/2014 1445   CO2 28 12/04/2013 1303   GLUCOSE 90 12/22/2014 1445   GLUCOSE 74 12/04/2013 1303   BUN 32* 12/22/2014 1445   BUN 23* 12/04/2013 1303   CREATININE 1.12* 12/22/2014 1445   CREATININE 1.18 12/04/2013 1303   CALCIUM 9.0 12/22/2014 1445   CALCIUM 9.0 12/04/2013 1303   PROT 6.8 12/22/2014 1445   PROT 6.6 12/04/2013 1303   ALBUMIN 3.5 12/22/2014 1445   ALBUMIN 3.1* 12/04/2013 1303   AST 17 12/22/2014 1445   AST 17 12/04/2013 1303   ALT 11* 12/22/2014 1445   ALT 16 12/04/2013 1303   ALKPHOS 55 12/22/2014 1445   ALKPHOS 66 12/04/2013 1303   BILITOT 0.3 12/22/2014 1445   BILITOT 0.2 12/04/2013 1303   GFRNONAA 42* 12/22/2014 1445   GFRNONAA 41* 12/04/2013 1303   GFRAA 49* 12/22/2014 1445   GFRAA 48* 12/04/2013 1303    Lab Results  Component Value Date   WBC 11.0 12/22/2014   NEUTROABS 9.0* 12/22/2014   HGB 11.3* 12/22/2014   HCT 35.7 12/22/2014   MCV 84.1 12/22/2014   PLT 388 12/22/2014    STUDIES: 08/27/14 - PET scan. IMPRESSION: 1. Significant progression of disease involving the chest. There are hypermetabolic left lung lesions and extensive pleural spread of tumor as described above. 2. Hypermetabolic fat between the right and left atria.  12/19/14 - CT scan chest without contrast.  IMPRESSION:  1. Today's study demonstrates progression of disease with interval enlargement of two left-sided pulmonary nodules and masses, as well as multiple pleural-based masses in the right hemithorax, with enlarging malignant right pleural effusion. Potential right internal mammary adenopathy (versus additional pleural disease) also noted. The largest pleural-based  lesion in the lower right hemithorax is associated with direct chest wall invasion and erosion into the posterior aspect of the right eleventh and twelfth ribs, as well as the right side of the T11 vertebral body.   2. Atherosclerosis, including three-vessel coronary artery disease.  3. Additional incidental findings, as above.   ASSESSMENT / PLAN:   1. Recurrent Non-Small cell Right Lung Cancer. Bronchoscopy/biopsy on 12/13/13 RIGHT LOWER LOBE POSITIVE FOR MALIGNANT CELLS. SQUAMOUS CELL CARCINOMA PRESENT. Aug 2015 - CT chest reported right infrahilar mass and mediastinal adenopathy PET-positive. PET scan 11/21/13 also reported faintly positive left lingula lesion. Known prior history of right lower lobe non small cell lung cancer s/p surgical resection on 2003. Remote h/o breast cancer in 1978. PET scan on 08/27/14 reported evidence of recurrent lung Ca with metastases in left lung - Reviewed CT scan done September 23 and discussed with patient and family present. She has developed new right pleural effusion, has dyspnea on exertion. Will request ultrasound-guided thoracentesis (therapeutic + diagnostic including cytology for malignant cells). Patient continues to have borderline performance status. States that she wants to consider pursuing  cancer treatment, will see her back in 1 week and consider starting on palliative treatment with Opdivo (Nivolumab).  2. Pain - no pain issues at this time.  3. In between visits, the patient has been advised to call or come to the ER in case of fevers, bleeding, acute sickness or new symptoms. She is agreeable to this plan.   Leia Alf, MD   01/03/2015 10:35 PM

## 2015-01-05 ENCOUNTER — Telehealth: Payer: Self-pay | Admitting: *Deleted

## 2015-01-05 ENCOUNTER — Inpatient Hospital Stay: Payer: Medicare Other

## 2015-01-05 ENCOUNTER — Inpatient Hospital Stay: Payer: Medicare Other | Admitting: Internal Medicine

## 2015-01-05 NOTE — Telephone Encounter (Signed)
Patient called answering service yesterday to cnl her chemotherapy appointment for today as she was "sick."  I called the patient personally. She had an "episode of chills yesterday for an hour." She contributed these symptoms to having an increase in her "duragesic patch from 12 mcg to 25 mcg or the use of Norco tablets." Pt states, "I couldn't get warm. I did not have a fever." I asked the patient is she took her temperature. She stated, "no, I just knew I didn't have a fever by the way I felt."  In conversation, the patient stated that she no longer wanted to consider receiving chemotherapy treatments.  However, she is willing to come in one day this week to meet with Dr. Rogue Bussing for palliative measures and medication management.  I offered to r/s her appointment with the md on 01/07/15 at 330pm for lab/md only.  Spoke with Forensic psychologist. She will bring all of the patient's medications bottles with her.    RN made Dr. Rogue Bussing aware of the patient's concerns and desires to d/c the plan to start opdivo

## 2015-01-07 ENCOUNTER — Encounter: Payer: Self-pay | Admitting: Internal Medicine

## 2015-01-07 ENCOUNTER — Inpatient Hospital Stay: Payer: Medicare Other | Attending: Internal Medicine

## 2015-01-07 ENCOUNTER — Inpatient Hospital Stay (HOSPITAL_BASED_OUTPATIENT_CLINIC_OR_DEPARTMENT_OTHER): Payer: Medicare Other | Admitting: Internal Medicine

## 2015-01-07 ENCOUNTER — Telehealth: Payer: Self-pay | Admitting: *Deleted

## 2015-01-07 VITALS — BP 134/63 | HR 63 | Temp 97.2°F | Resp 18 | Wt 103.4 lb

## 2015-01-07 DIAGNOSIS — C3431 Malignant neoplasm of lower lobe, right bronchus or lung: Secondary | ICD-10-CM | POA: Insufficient documentation

## 2015-01-07 DIAGNOSIS — J449 Chronic obstructive pulmonary disease, unspecified: Secondary | ICD-10-CM | POA: Insufficient documentation

## 2015-01-07 DIAGNOSIS — C7802 Secondary malignant neoplasm of left lung: Secondary | ICD-10-CM

## 2015-01-07 DIAGNOSIS — C3411 Malignant neoplasm of upper lobe, right bronchus or lung: Secondary | ICD-10-CM

## 2015-01-07 DIAGNOSIS — Z886 Allergy status to analgesic agent status: Secondary | ICD-10-CM | POA: Diagnosis not present

## 2015-01-07 DIAGNOSIS — I10 Essential (primary) hypertension: Secondary | ICD-10-CM

## 2015-01-07 DIAGNOSIS — C3410 Malignant neoplasm of upper lobe, unspecified bronchus or lung: Secondary | ICD-10-CM

## 2015-01-07 DIAGNOSIS — Z87891 Personal history of nicotine dependence: Secondary | ICD-10-CM | POA: Insufficient documentation

## 2015-01-07 DIAGNOSIS — Z853 Personal history of malignant neoplasm of breast: Secondary | ICD-10-CM | POA: Insufficient documentation

## 2015-01-07 DIAGNOSIS — Z79899 Other long term (current) drug therapy: Secondary | ICD-10-CM | POA: Diagnosis not present

## 2015-01-07 DIAGNOSIS — Z85118 Personal history of other malignant neoplasm of bronchus and lung: Secondary | ICD-10-CM | POA: Insufficient documentation

## 2015-01-07 DIAGNOSIS — Z923 Personal history of irradiation: Secondary | ICD-10-CM | POA: Diagnosis not present

## 2015-01-07 DIAGNOSIS — E119 Type 2 diabetes mellitus without complications: Secondary | ICD-10-CM

## 2015-01-07 DIAGNOSIS — C50919 Malignant neoplasm of unspecified site of unspecified female breast: Secondary | ICD-10-CM

## 2015-01-07 DIAGNOSIS — M25551 Pain in right hip: Secondary | ICD-10-CM

## 2015-01-07 DIAGNOSIS — C771 Secondary and unspecified malignant neoplasm of intrathoracic lymph nodes: Secondary | ICD-10-CM | POA: Diagnosis not present

## 2015-01-07 DIAGNOSIS — C3490 Malignant neoplasm of unspecified part of unspecified bronchus or lung: Secondary | ICD-10-CM

## 2015-01-07 LAB — BASIC METABOLIC PANEL
ANION GAP: 8 (ref 5–15)
BUN: 45 mg/dL — ABNORMAL HIGH (ref 6–20)
CHLORIDE: 101 mmol/L (ref 101–111)
CO2: 22 mmol/L (ref 22–32)
Calcium: 8.1 mg/dL — ABNORMAL LOW (ref 8.9–10.3)
Creatinine, Ser: 1.32 mg/dL — ABNORMAL HIGH (ref 0.44–1.00)
GFR calc Af Amer: 40 mL/min — ABNORMAL LOW (ref >60)
GFR calc non Af Amer: 35 mL/min — ABNORMAL LOW (ref >60)
GLUCOSE: 241 mg/dL — AB (ref 65–99)
POTASSIUM: 4.2 mmol/L (ref 3.5–5.1)
Sodium: 131 mmol/L — ABNORMAL LOW (ref 135–145)

## 2015-01-07 LAB — CBC WITH DIFFERENTIAL/PLATELET
BASOS ABS: 0 10*3/uL (ref 0–0.1)
Basophils Relative: 0 %
Eosinophils Absolute: 0.1 10*3/uL (ref 0–0.7)
Eosinophils Relative: 1 %
HEMATOCRIT: 35 % (ref 35.0–47.0)
Hemoglobin: 11.2 g/dL — ABNORMAL LOW (ref 12.0–16.0)
LYMPHS PCT: 4 %
Lymphs Abs: 0.4 10*3/uL — ABNORMAL LOW (ref 1.0–3.6)
MCH: 26.7 pg (ref 26.0–34.0)
MCHC: 32.1 g/dL (ref 32.0–36.0)
MCV: 83.3 fL (ref 80.0–100.0)
MONO ABS: 0.8 10*3/uL (ref 0.2–0.9)
Monocytes Relative: 8 %
NEUTROS ABS: 8.3 10*3/uL — AB (ref 1.4–6.5)
Neutrophils Relative %: 87 %
Platelets: 404 10*3/uL (ref 150–440)
RBC: 4.21 MIL/uL (ref 3.80–5.20)
RDW: 18.4 % — ABNORMAL HIGH (ref 11.5–14.5)
WBC: 9.6 10*3/uL (ref 3.6–11.0)

## 2015-01-07 LAB — HEPATIC FUNCTION PANEL
ALBUMIN: 2.9 g/dL — AB (ref 3.5–5.0)
ALT: 12 U/L — ABNORMAL LOW (ref 14–54)
AST: 20 U/L (ref 15–41)
Alkaline Phosphatase: 59 U/L (ref 38–126)
BILIRUBIN INDIRECT: 0.3 mg/dL (ref 0.3–0.9)
Bilirubin, Direct: 0.1 mg/dL (ref 0.1–0.5)
TOTAL PROTEIN: 6.3 g/dL — AB (ref 6.5–8.1)
Total Bilirubin: 0.4 mg/dL (ref 0.3–1.2)

## 2015-01-07 MED ORDER — FENTANYL 25 MCG/HR TD PT72: 25.0000 ug | MEDICATED_PATCH | TRANSDERMAL | Status: AC

## 2015-01-07 NOTE — Telephone Encounter (Signed)
Hospice referral initiated. Per patient and caregiver agency preference was [facility]. Patient prefers to have hospice in her own home.

## 2015-01-07 NOTE — Progress Notes (Signed)
[facility] OFFICE PROGRESS NOTE  Patient Care Team: Adin Hector, MD as PCP - General (Internal Medicine)   SUMMARY OF ONCOLOGIC HISTORY:  # SEP 2015- RLL SQUAMOUS CELL CA- no treatment; SEP 2016- PROGRESSION on PET; METASTATIC SQUAMOUS/STAGE IV;   # 2003 STAGE I ADENO LUNG ca s/p Lobectomy; Hx of Breast Ca; COPD   INTERVAL HISTORY:    81 year old female patient with above history of  Metastatic squamous cell lung cancer is currently for follow-up/ review her treatment options.   Most recently she was recommended  opdivo  Versus hospice /palliative care. Patient  Had been undecided.   Patient  States her pain is well controlled on fentanyl patch.  She has chronic shortness of breath not significantly worse [ She recently had thoracentesis done].   her appetite is fair.  Denies any nausea vomiting.  No chest pain. No unusual cough. No headaches or vision changes.  REVIEW OF SYSTEMS:  A complete 10 point review of system is done which is negative except mentioned above/history of present illness.   PAST MEDICAL HISTORY :  Past Medical History  Diagnosis Date  . Hypertension   . Diabetes mellitus without complication ([place])   . COPD (chronic obstructive pulmonary disease) ([place])   . Breast cancer ([place])   . Lung cancer ([place])   . Compression fracture     PAST SURGICAL HISTORY :   Past Surgical History  Procedure Laterality Date  . Back surgery    . Lung removal, partial    . Breast lumpectomy Right   . Abdominal hysterectomy      FAMILY HISTORY :   Family History  Problem Relation Age of Onset  . Lupus Mother   . Lupus Sister     SOCIAL HISTORY:   Social History  Substance Use Topics  . Smoking status: Former Smoker -- 1.00 packs/day for 30 years    Types: Cigarettes  . Smokeless tobacco: Never Used  . Alcohol Use: No    ALLERGIES:  is allergic to asa; alendronate sodium; fluticasone propionate; fluticasone propionate (inhal); ibuprofen; ivp  dye; metformin hcl; morphine and related; penicillins; and tape.  MEDICATIONS:  Current Outpatient Prescriptions  Medication Sig Dispense Refill  . acetaminophen (TYLENOL) 500 MG tablet Take 500 mg by mouth every 6 (six) hours as needed.    Marland Kitchen ADVAIR DISKUS 250-50 MCG/DOSE AEPB     . albuterol (PROVENTIL) (2.5 MG/3ML) 0.083% nebulizer solution Take 3 mLs (2.5 mg total) by nebulization every 4 (four) hours as needed for wheezing or shortness of breath. 540 mL 3  . calcium carbonate (OS-CAL) 600 MG tablet Take by mouth.    . COMBIVENT RESPIMAT 20-100 MCG/ACT AERS respimat     . Cyanocobalamin 1000 MCG TBCR Take by mouth.    . enalapril (VASOTEC) 10 MG tablet Take 10 mg by mouth 2 (two) times daily.     . fentaNYL (DURAGESIC - DOSED MCG/HR) 25 MCG/HR patch Place 1 patch (25 mcg total) onto the skin every 3 (three) days. 10 patch 0  . gabapentin (NEURONTIN) 100 MG capsule Take 100 mg by mouth 2 (two) times daily.     Marland Kitchen glipiZIDE (GLUCOTROL XL) 5 MG 24 hr tablet     . hydrochlorothiazide (HYDRODIURIL) 25 MG tablet Take 25 mg by mouth daily.     . Ipratropium-Albuterol (COMBIVENT) 20-100 MCG/ACT AERS respimat Inhale into the lungs.    Marland Kitchen LORazepam (ATIVAN) 1 MG tablet     . ranitidine (ZANTAC)  150 MG tablet     . HYDROcodone-acetaminophen (NORCO) 5-325 MG per tablet Take 1 tablet by mouth every 6 (six) hours as needed for moderate pain. (Patient not taking: Reported on 01/07/2015) 60 tablet 0  . spironolactone (ALDACTONE) 50 MG tablet Take 0.5 tablets (25 mg total) by mouth 2 (two) times daily. (Patient not taking: Reported on 01/07/2015) 30 tablet 1  . traMADol (ULTRAM) 50 MG tablet Take 1 tablet (50 mg total) by mouth every 6 (six) hours as needed. (Patient not taking: Reported on 01/07/2015) 60 tablet 0  . zolpidem (AMBIEN) 5 MG tablet Take 1 tablet (5 mg total) by mouth at bedtime as needed for sleep. (Patient not taking: Reported on 01/07/2015) 30 tablet 1   No current facility-administered  medications for this visit.    PHYSICAL EXAMINATION: ECOG PERFORMANCE STATUS: 3 - Symptomatic, >50% confined to bed  BP 134/63 mmHg  Pulse 63  Temp(Src) 97.2 F (36.2 C) (Tympanic)  Resp 18  Wt 103 lb 6.3 oz (46.9 kg)  SpO2 95%  Filed Weights   01/07/15 1614  Weight: 103 lb 6.3 oz (46.9 kg)    GENERAL:  Frail cachectic-appearing Caucasian female patient. She is walking with a rolling walker she is accompanied by a friend. EYES: no pallor or icterus OROPHARYNX:  Poor dentition  NECK: supple, no masses felt LYMPH:  no palpable lymphadenopathy in the cervical, axillary or inguinal regions LUNGS:  Right lung decreased breath sounds. No wheeze or crackles HEART/CVS: regular rate & rhythm and no murmurs;  1+ bilateral lower extremity edema ABDOMEN:abdomen soft, non-tender and normal bowel sounds Musculoskeletal:no cyanosis of digits and no clubbing  PSYCH: alert & oriented x 3 with fluent speech NEURO: no focal motor/sensory deficits SKIN:  no rashes or significant lesions  LABORATORY DATA:  I have reviewed the data as listed    Component Value Date/Time   NA 131* 01/07/2015 1537   NA 141 12/04/2013 1303   K 4.2 01/07/2015 1537   K 4.4 12/04/2013 1303   CL 101 01/07/2015 1537   CL 106 12/04/2013 1303   CO2 22 01/07/2015 1537   CO2 28 12/04/2013 1303   GLUCOSE 241* 01/07/2015 1537   GLUCOSE 74 12/04/2013 1303   BUN 45* 01/07/2015 1537   BUN 23* 12/04/2013 1303   CREATININE 1.32* 01/07/2015 1537   CREATININE 1.18 12/04/2013 1303   CALCIUM 8.1* 01/07/2015 1537   CALCIUM 9.0 12/04/2013 1303   PROT 6.3* 01/07/2015 1537   PROT 6.6 12/04/2013 1303   ALBUMIN 2.9* 01/07/2015 1537   ALBUMIN 3.1* 12/04/2013 1303   AST 20 01/07/2015 1537   AST 17 12/04/2013 1303   ALT 12* 01/07/2015 1537   ALT 16 12/04/2013 1303   ALKPHOS 59 01/07/2015 1537   ALKPHOS 66 12/04/2013 1303   BILITOT 0.4 01/07/2015 1537   BILITOT 0.2 12/04/2013 1303   GFRNONAA 35* 01/07/2015 1537   GFRNONAA  41* 12/04/2013 1303   GFRAA 40* 01/07/2015 1537   GFRAA 48* 12/04/2013 1303    No results found for: SPEP, UPEP  Lab Results  Component Value Date   WBC 9.6 01/07/2015   NEUTROABS 8.3* 01/07/2015   HGB 11.2* 01/07/2015   HCT 35.0 01/07/2015   MCV 83.3 01/07/2015   PLT 404 01/07/2015      Chemistry      Component Value Date/Time   NA 131* 01/07/2015 1537   NA 141 12/04/2013 1303   K 4.2 01/07/2015 1537   K 4.4 12/04/2013 1303  CL 101 01/07/2015 1537   CL 106 12/04/2013 1303   CO2 22 01/07/2015 1537   CO2 28 12/04/2013 1303   BUN 45* 01/07/2015 1537   BUN 23* 12/04/2013 1303   CREATININE 1.32* 01/07/2015 1537   CREATININE 1.18 12/04/2013 1303      Component Value Date/Time   CALCIUM 8.1* 01/07/2015 1537   CALCIUM 9.0 12/04/2013 1303   ALKPHOS 59 01/07/2015 1537   ALKPHOS 66 12/04/2013 1303   AST 20 01/07/2015 1537   AST 17 12/04/2013 1303   ALT 12* 01/07/2015 1537   ALT 16 12/04/2013 1303   BILITOT 0.4 01/07/2015 1537   BILITOT 0.2 12/04/2013 1303       RADIOGRAPHIC STUDIES: I have personally reviewed the radiological images as listed and agreed with the findings in the report.    ASSESSMENT & PLAN:   #  Metastatic squamous cell lung cancer-  Progressive  On the most recent scan in September compared to the scan in  June 2016.  I reviewed the images myself; reviewed the images with the patient and her friend in detail.  There are also given a copy of the report.  #  I had a long discussion with the patient and her friend regarding the futility of  Doing palliative treatments including  opdivo given her poor performance status/ advanced age/  Reluctance to proceed with any treatments.  I definitely think she is at high risk for side effects and  Modest  Benefit at best.   #  I discussed at length  The progressive symptoms the patient might experience with progression of her malignancy.  I recommend hospice/palliative care;  I discussed the hospice philosophy in  detail.  She and her friend  state that  They are interested  In hospice.   No orders of the defined types were placed in this encounter.   All questions were answered. The patient knows to call the clinic with any problems, questions or concerns. No barriers to learning was detected. I spent 40 minutes counseling the patient face to face. The total time spent in the appointment was 55 minutes and more than 50% was on counseling and review of test results     Cammie Sickle, MD 01/07/2015 4:39 PM

## 2015-01-15 ENCOUNTER — Telehealth: Payer: Self-pay | Admitting: *Deleted

## 2015-01-15 MED ORDER — HYDROCODONE-ACETAMINOPHEN 7.5-325 MG PO TABS: 1.0000 | ORAL_TABLET | Freq: Four times a day (QID) | ORAL | Status: AC | PRN

## 2015-01-15 NOTE — Telephone Encounter (Signed)
Rx faxed

## 2015-01-15 NOTE — Telephone Encounter (Signed)
Increase hydrocodone to 7.5 mg Stop Ca+, Cyanocablamin, HCTZ, Aldactone, Tramadol. Magda Paganini informed

## 2015-01-15 NOTE — Telephone Encounter (Signed)
Fentanyl 25 mcg not controlling pain asking for an increase in patches and needs a refill on patches too. She is also requesting D rB look over her med list and let her know what meds can be discontinued as she is now hospice

## 2015-01-27 DEATH — deceased

## 2015-02-12 ENCOUNTER — Ambulatory Visit: Payer: Medicare Other | Admitting: Radiation Oncology

## 2015-09-16 ENCOUNTER — Other Ambulatory Visit: Payer: Self-pay | Admitting: Nurse Practitioner

## 2016-09-27 NOTE — Telephone Encounter (Signed)
done

## 2017-02-01 IMAGING — CT CT CHEST W/O CM
1 series · 14 of 33 positions shown, 18 images · non-contrast
Comparison: Multiple priors, most recently PET-CT 08/27/2014, and
chest CT 07/22/2014.

CLINICAL DATA: Subsequent evaluation of a 89-year-old female with
history of recurrent non-small cell lung cancer.

EXAM:
CT CHEST WITHOUT CONTRAST
TECHNIQUE: Multidetector CT imaging of the chest was performed following the
standard protocol without IV contrast.

[Series 2: routine chest wo · axial · 0.56mm/px · z∈[-610,-370]mm · 14 of 58 slices shown, 18 images]
[im 5/58  mediastinal]
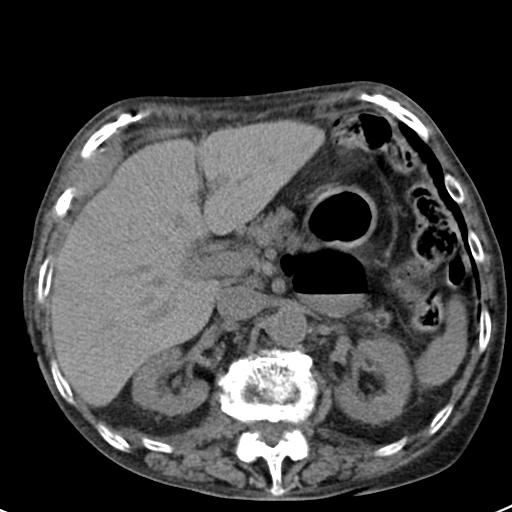
[im 5/58  lung]
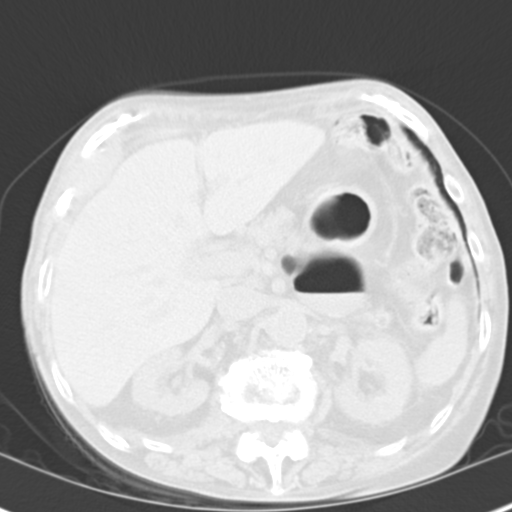
[im 9/58  lung]
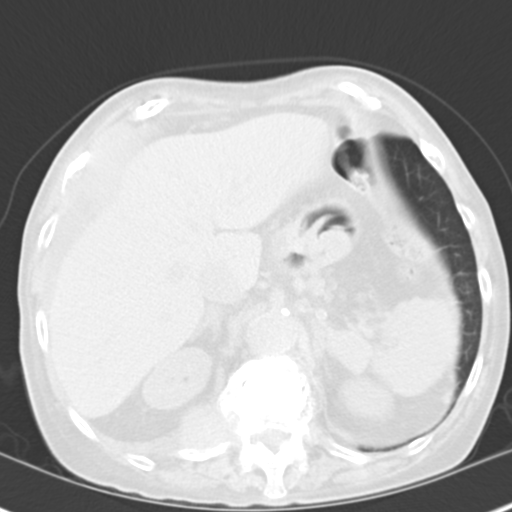
[im 12/58  lung]
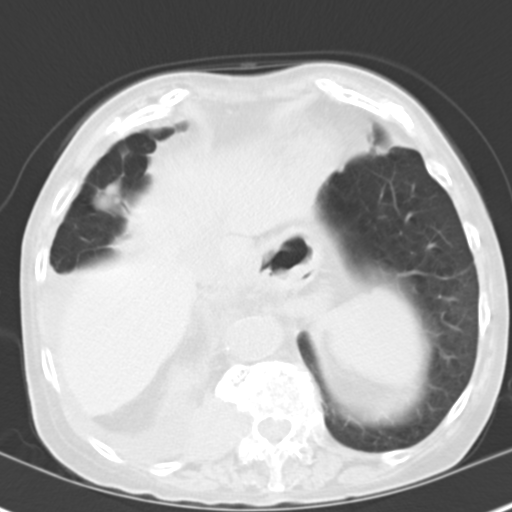
[im 15/58  lung]
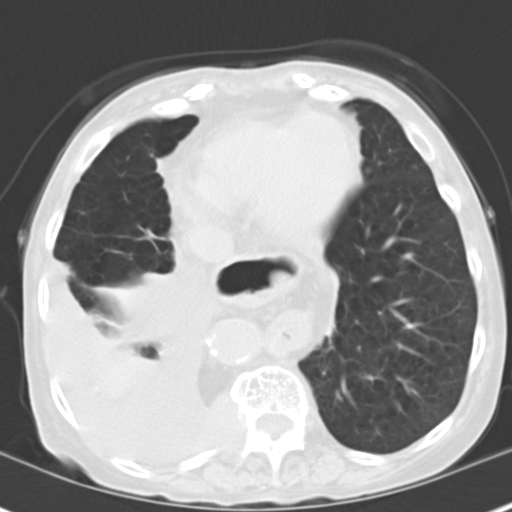
[im 20/58  mediastinal]
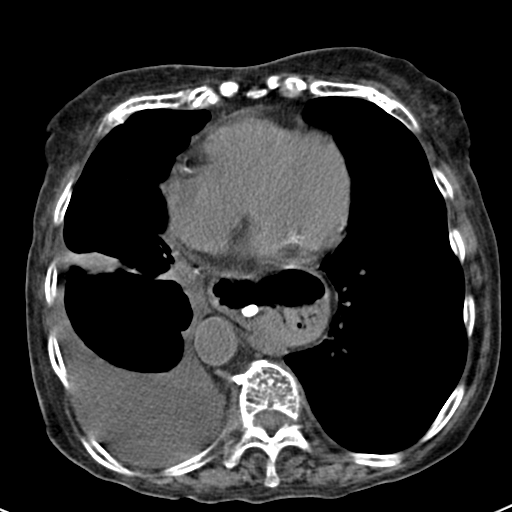
[im 20/58  lung]
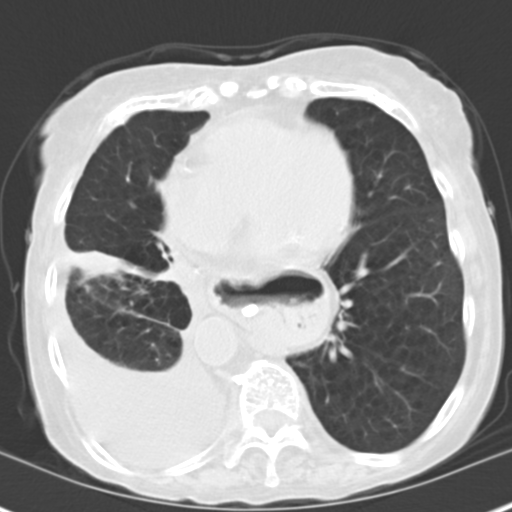
[im 24/58  lung]
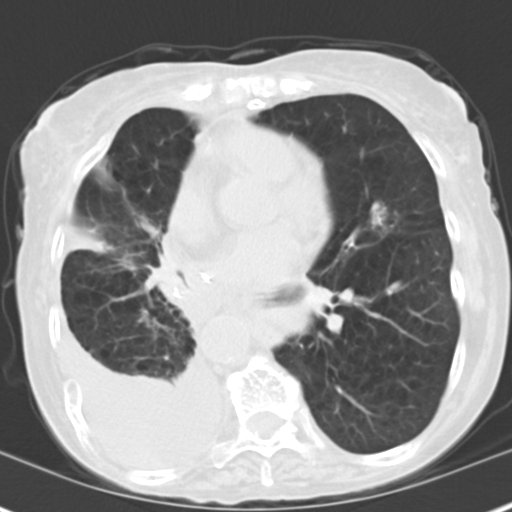
[im 28/58  lung]
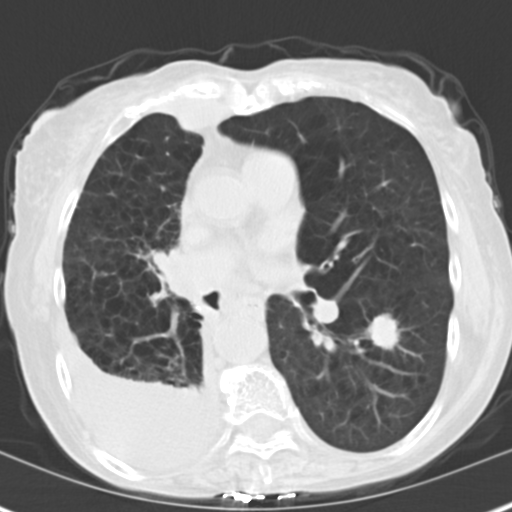
[im 31/58  lung]
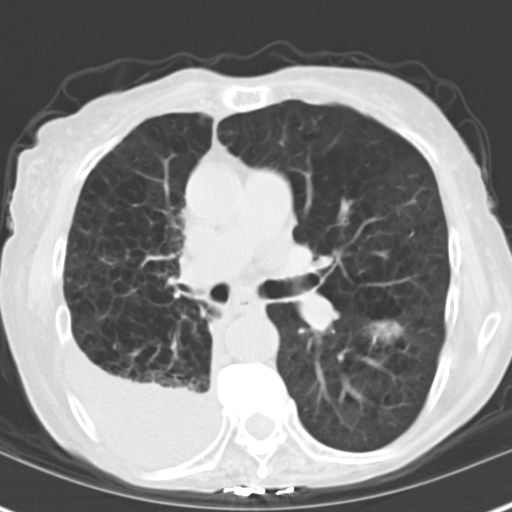
[im 34/58  mediastinal]
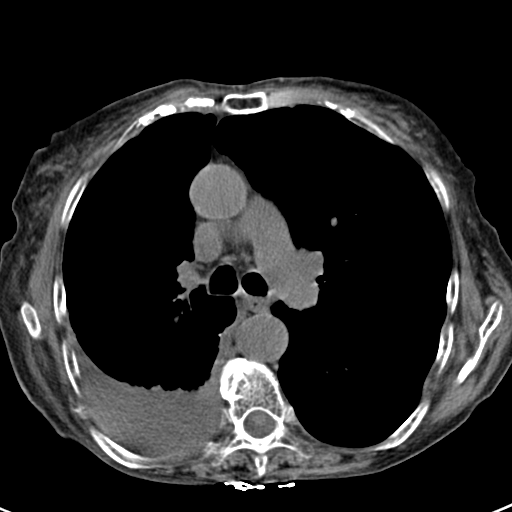
[im 34/58  lung]
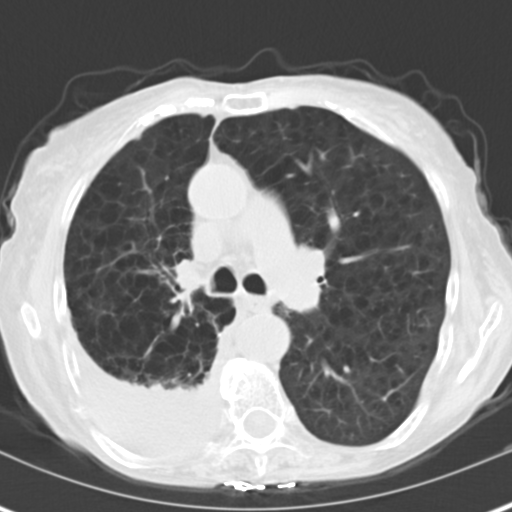
[im 39/58  lung]
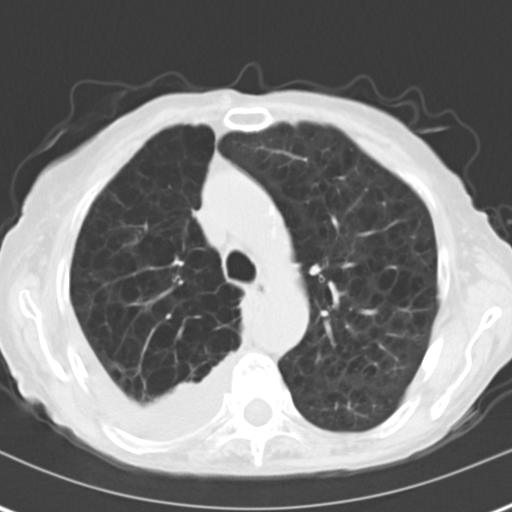
[im 43/58  lung]
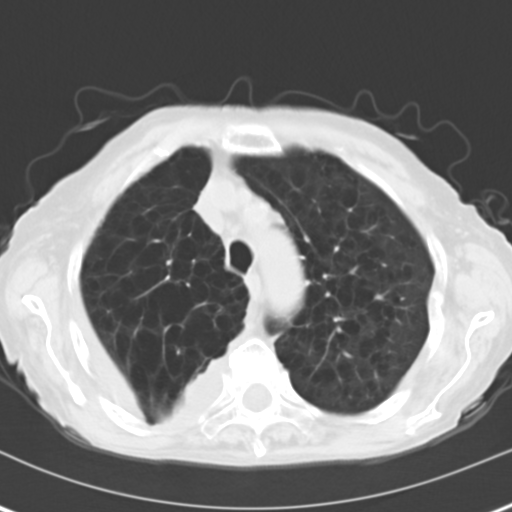
[im 46/58  lung]
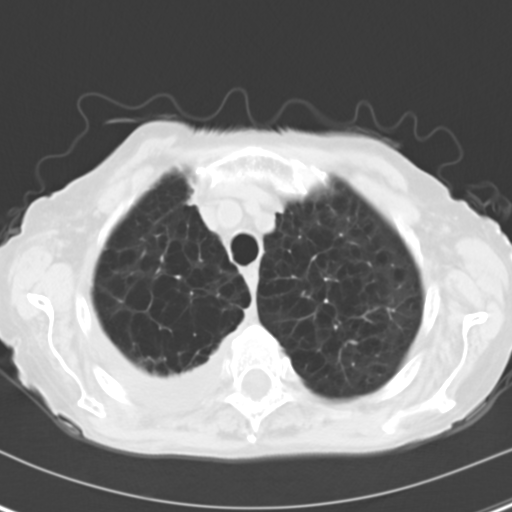
[im 49/58  mediastinal]
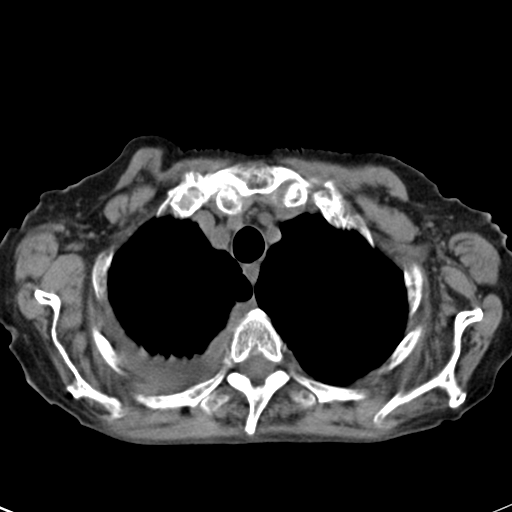
[im 49/58  lung]
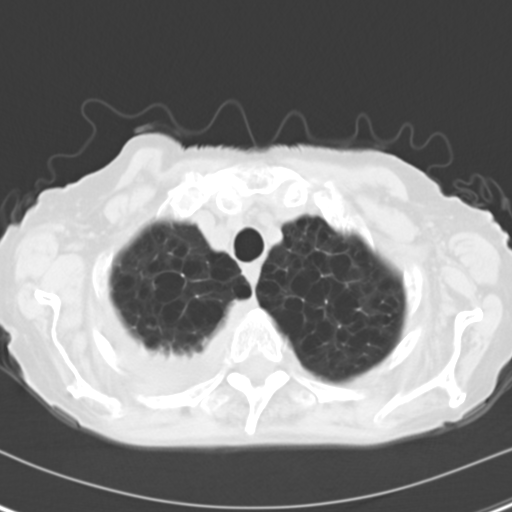
[im 53/58  lung]
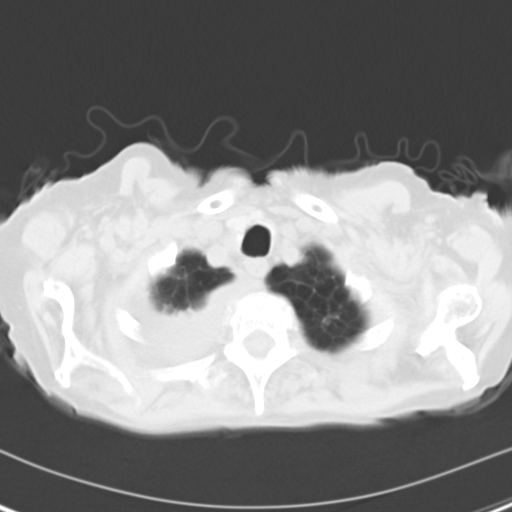

[14 of 33 positions shown; findings below may reference images not displayed]

FINDINGS: Mediastinum/Lymph Nodes: Heart size is normal. There is no
significant pericardial fluid, thickening or pericardial
calcification. There is atherosclerosis of the thoracic aorta, the
great vessels of the mediastinum and the coronary arteries,
including calcified atherosclerotic plaque in the left anterior
descending, left circumflex and right coronary arteries. In the
inferior aspect of the right hemithorax there is a 1.7 x 4.1 cm soft
tissue lesion, which may represent enlarged and enlarged right
internal mammary lymph node, or could simply represent a pleural
based metastatic lesion. No other definite mediastinal or hilar
lymphadenopathy noted on today's noncontrast CT examination. Please
note that accurate exclusion of hilar adenopathy is limited on
noncontrast CT scans. Large hiatal hernia. No axillary
lymphadenopathy.

Lungs/Pleura: Previously noted nodule in the left lower lobe has
increased in size significantly, currently 1.9 x 2.4 cm (previously
1.5 cm). Additionally, there has been interval enlargement of the
mixed ground-glass attenuation and solid nodule in the left upper
lobe, which currently measures up to 3.2 x 2.3 cm and has a
peripheral solid component measuring up to 2.3 cm in length. Also
noted is an enlarging pleural-based mass in the anterior aspect of
the right hemithorax, which currently measures 4.2 x 4.6 cm (image
48 of series 2), and appears to be involving the adjacent posterior
right eleventh and twelfth ribs, as well as the lateral aspect of
the right T11 vertebral body. Other areas of mass-like pleural
thickening are noted in the right hemithorax, particularly
inferiorly overlying the right hemidiaphragm, coming in close
proximity to the underlying liver. Moderate right pleural effusion
(likely malignant) predominantly layering dependently. Mild diffuse
bronchial wall thickening with severe centrilobular emphysema.

Upper Abdomen: Calcified granulomas in the liver.  Atherosclerosis.

Musculoskeletal/Soft Tissues: As discussed above, there is
osteolysis in the posterior aspect of the right eleventh and twelfth
ribs, as well as lateral aspect of the right T11 vertebral body,
presumably from direct invasion by the adjacent pleural based mass.
Multiple old healed bilateral rib fractures.
IMPRESSION: 1. Today's study demonstrates progression of disease with interval
enlargement of two left-sided pulmonary nodules and masses, as well
as multiple pleural-based masses in the right hemithorax, with
enlarging malignant right pleural effusion. Potential right internal
mammary adenopathy (versus additional pleural disease) also noted.
The largest pleural-based lesion in the lower right hemithorax is
associated with direct chest wall invasion and erosion into the
posterior aspect of the right eleventh and twelfth ribs, as well as
the right side of the T11 vertebral body.
2.  Atherosclerosis, including three-vessel coronary artery disease.
3. Additional incidental findings, as above.
